# Patient Record
Sex: Female | Born: 1960 | Race: White | Hispanic: No | Marital: Married | Smoking: Former smoker
Health system: Southern US, Community
[De-identification: ages and names within clinical notes are randomized; demographics above are authoritative.]

## PROBLEM LIST (undated history)

## (undated) DIAGNOSIS — M329 Systemic lupus erythematosus, unspecified: Secondary | ICD-10-CM

## (undated) DIAGNOSIS — M1990 Unspecified osteoarthritis, unspecified site: Secondary | ICD-10-CM

## (undated) DIAGNOSIS — J449 Chronic obstructive pulmonary disease, unspecified: Secondary | ICD-10-CM

## (undated) DIAGNOSIS — I219 Acute myocardial infarction, unspecified: Secondary | ICD-10-CM

## (undated) DIAGNOSIS — I509 Heart failure, unspecified: Secondary | ICD-10-CM

## (undated) DIAGNOSIS — E039 Hypothyroidism, unspecified: Secondary | ICD-10-CM

## (undated) DIAGNOSIS — N3010 Interstitial cystitis (chronic) without hematuria: Secondary | ICD-10-CM

## (undated) DIAGNOSIS — I10 Essential (primary) hypertension: Secondary | ICD-10-CM

## (undated) DIAGNOSIS — N189 Chronic kidney disease, unspecified: Secondary | ICD-10-CM

## (undated) HISTORY — PX: ABDOMINAL HYSTERECTOMY: SHX81

## (undated) HISTORY — PX: CHOLECYSTECTOMY: SHX55

## (undated) HISTORY — PX: LEG SURGERY: SHX1003

---

## 2007-04-23 ENCOUNTER — Encounter: Admission: RE | Admit: 2007-04-23 | Discharge: 2007-04-23 | Payer: Self-pay | Admitting: Orthopedic Surgery

## 2007-05-15 ENCOUNTER — Ambulatory Visit (HOSPITAL_COMMUNITY): Admission: RE | Admit: 2007-05-15 | Discharge: 2007-05-16 | Payer: Self-pay | Admitting: Orthopedic Surgery

## 2009-02-27 ENCOUNTER — Ambulatory Visit (HOSPITAL_BASED_OUTPATIENT_CLINIC_OR_DEPARTMENT_OTHER): Admission: RE | Admit: 2009-02-27 | Discharge: 2009-02-27 | Payer: Self-pay | Admitting: Urology

## 2010-04-08 LAB — POCT HEMOGLOBIN-HEMACUE: Hemoglobin: 16.5 g/dL — ABNORMAL HIGH (ref 12.0–15.0)

## 2010-06-01 NOTE — Op Note (Signed)
[name], [name]                 ACCOUNT NO.:  [id number]   MEDICAL RECORD NO.:  [id number]          PATIENT TYPE:  OIB   LOCATION:  5023                         FACILITY:  [facility]   PHYSICIAN:  Burnard Bunting, M.D.    DATE OF BIRTH:  [birth year]   DATE OF PROCEDURE:  05/15/2007  DATE OF DISCHARGE:                               OPERATIVE REPORT   PREOPERATIVE DIAGNOSIS:  Left fibular nonunion.   POSTOPERATIVE DIAGNOSIS:  Left fibular nonunion.   PROCEDURE:  Left fibular nonunion take down with plating and iliac crest  bone grafting.   SURGEON:  Burnard Bunting, MD   ASSISTANT:  None.   ANESTHESIA:  General endotracheal.   ESTIMATED BLOOD LOSS:  Minimal.   INDICATIONS:  Kimberly Mahoney is a 50 year old patient with left fibular  nonunion, who presents now for operative management after established  nonunion.  She understands the risks and benefits and wants to proceed  with surgery.   PROCEDURE IN DETAIL:  The patient was brought to the operating room,  where general endotracheal anesthesia was induced and preoperative  antibiotics were administered.  The patient's left iliac crest and left  leg was briefly scrubbed with alcohol and Betadine, which allowed to air  dry and then prepped with DuraPrep solution and draped in a sterile  manner.  Collier Flowers was used to cover both operative fields.  A 3-cm incision  was made just about 2 cm proximal to the anterior-superior iliac crest.  The skin and subcutaneous tissue were sharply divided.  A periosteal  window was then raised off the iliac crest.  Osteotome was used to open  this window on three sides.  It was then hinged open and iliac crest  bone graft was obtained using a curette.  After obtaining the bone  graft, the #1 Vicryl suture was used to close the cortical hinge.  The  skin was then closed using interrupted 0-Vicryl suture and running  through a Prolene.  The anesthetic agent was placed into the skin edges.  This was  Steri-Stripped and impervious dressing was placed.  At this  time, attention was directed towards the ankle.  Ankle Esmarch was  utilized for approximately 45 minutes.  Lateral incision was made.  Full-  thickness periosteal flaps were developed from the anteroposterior  aspect of the fibula.  Fibular nonunion site was identified.  Soft  tissue and fibrous tissue was removed from the fracture site using a  small curette.  Both intramedullary canals were reestablished also using  curettes.  At this time, iliac crest bone grafting was placed into the  fracture site.  The fracture site was reduced and then held with a lag  screw.  This was confirmed in the AP and lateral planes under  fluoroscopy.  A plate was then applied with cortical and locking screws  with good reduction of pain.  At this time, tourniquet was released.  Bleeding points were cauterized with electrocautery.  Fascia was closed  over the plate.  Good plate distance and  good screw length was confirmed in the AP and lateral  planes under  fluoroscopy.  Fascia was then closed using 2-0 Vicryl followed by  interrupted 2-0 Vicryl and 3-0 nylon in the skin edges.  A bulky well-  padded posterior splint was applied.  The patient tolerated the  procedure well without immediate complication.      Burnard Bunting, M.D.  Electronically Signed     GSD/MEDQ  D:  05/15/2007  T:  05/16/2007  Job:  [id number]

## 2010-10-12 LAB — URINALYSIS, ROUTINE W REFLEX MICROSCOPIC
Bilirubin Urine: NEGATIVE
Glucose, UA: NEGATIVE
Hgb urine dipstick: NEGATIVE
Ketones, ur: NEGATIVE
Nitrite: NEGATIVE
Protein, ur: NEGATIVE
Specific Gravity, Urine: 1.018
Urobilinogen, UA: 0.2
pH: 5.5

## 2010-10-12 LAB — BASIC METABOLIC PANEL
BUN: 11
CO2: 24
Calcium: 8.8
Chloride: 104
Creatinine, Ser: 0.9
GFR calc Af Amer: >60
GFR calc non Af Amer: >60
Glucose, Bld: 103 — ABNORMAL HIGH
Potassium: 4.2
Sodium: 135

## 2010-10-12 LAB — URINE MICROSCOPIC-ADD ON

## 2010-10-12 LAB — CBC
HCT: 40.3
Hemoglobin: 14
MCHC: 34.8
MCV: 100.7 — ABNORMAL HIGH
Platelets: 174
RBC: 4.01
RDW: 12.7
WBC: 3.2 — ABNORMAL LOW

## 2011-09-16 ENCOUNTER — Ambulatory Visit (INDEPENDENT_AMBULATORY_CARE_PROVIDER_SITE_OTHER): Payer: BC Managed Care – PPO | Admitting: Urology

## 2011-09-16 DIAGNOSIS — R351 Nocturia: Secondary | ICD-10-CM

## 2011-09-16 DIAGNOSIS — N393 Stress incontinence (female) (male): Secondary | ICD-10-CM

## 2011-09-16 DIAGNOSIS — R3989 Other symptoms and signs involving the genitourinary system: Secondary | ICD-10-CM

## 2011-09-16 DIAGNOSIS — N3010 Interstitial cystitis (chronic) without hematuria: Secondary | ICD-10-CM

## 2011-09-16 DIAGNOSIS — R350 Frequency of micturition: Secondary | ICD-10-CM

## 2012-02-24 ENCOUNTER — Ambulatory Visit (INDEPENDENT_AMBULATORY_CARE_PROVIDER_SITE_OTHER): Payer: BC Managed Care – PPO | Admitting: Urology

## 2012-02-24 DIAGNOSIS — N3010 Interstitial cystitis (chronic) without hematuria: Secondary | ICD-10-CM

## 2012-11-09 ENCOUNTER — Encounter (INDEPENDENT_AMBULATORY_CARE_PROVIDER_SITE_OTHER): Payer: Self-pay

## 2012-11-09 ENCOUNTER — Ambulatory Visit (INDEPENDENT_AMBULATORY_CARE_PROVIDER_SITE_OTHER): Payer: BC Managed Care – PPO | Admitting: Urology

## 2012-11-09 DIAGNOSIS — N3010 Interstitial cystitis (chronic) without hematuria: Secondary | ICD-10-CM

## 2012-11-09 DIAGNOSIS — N393 Stress incontinence (female) (male): Secondary | ICD-10-CM

## 2013-02-08 ENCOUNTER — Ambulatory Visit (INDEPENDENT_AMBULATORY_CARE_PROVIDER_SITE_OTHER): Payer: BC Managed Care – PPO | Admitting: Urology

## 2013-02-08 DIAGNOSIS — N393 Stress incontinence (female) (male): Secondary | ICD-10-CM

## 2013-02-08 DIAGNOSIS — N3010 Interstitial cystitis (chronic) without hematuria: Secondary | ICD-10-CM

## 2013-05-17 ENCOUNTER — Ambulatory Visit (INDEPENDENT_AMBULATORY_CARE_PROVIDER_SITE_OTHER): Payer: BC Managed Care – PPO | Admitting: Urology

## 2013-05-17 DIAGNOSIS — N3020 Other chronic cystitis without hematuria: Secondary | ICD-10-CM

## 2013-08-16 ENCOUNTER — Ambulatory Visit (INDEPENDENT_AMBULATORY_CARE_PROVIDER_SITE_OTHER): Payer: BC Managed Care – PPO | Admitting: Urology

## 2013-08-16 DIAGNOSIS — N3010 Interstitial cystitis (chronic) without hematuria: Secondary | ICD-10-CM

## 2013-11-15 ENCOUNTER — Ambulatory Visit (INDEPENDENT_AMBULATORY_CARE_PROVIDER_SITE_OTHER): Payer: BC Managed Care – PPO | Admitting: Urology

## 2013-11-15 DIAGNOSIS — N3010 Interstitial cystitis (chronic) without hematuria: Secondary | ICD-10-CM

## 2013-11-15 DIAGNOSIS — R3989 Other symptoms and signs involving the genitourinary system: Secondary | ICD-10-CM

## 2014-03-28 ENCOUNTER — Ambulatory Visit: Payer: Self-pay | Admitting: Urology

## 2014-08-15 ENCOUNTER — Ambulatory Visit (INDEPENDENT_AMBULATORY_CARE_PROVIDER_SITE_OTHER): Payer: BLUE CROSS/BLUE SHIELD | Admitting: Urology

## 2014-08-15 DIAGNOSIS — N393 Stress incontinence (female) (male): Secondary | ICD-10-CM | POA: Diagnosis not present

## 2014-08-15 DIAGNOSIS — N3010 Interstitial cystitis (chronic) without hematuria: Secondary | ICD-10-CM

## 2014-09-24 ENCOUNTER — Emergency Department (HOSPITAL_COMMUNITY)
Admission: EM | Admit: 2014-09-24 | Discharge: 2014-09-24 | Disposition: A | Discharge status: Home / Self Care | Payer: BLUE CROSS/BLUE SHIELD | Attending: Emergency Medicine | Admitting: Emergency Medicine

## 2014-09-24 ENCOUNTER — Encounter (HOSPITAL_COMMUNITY): Payer: Self-pay | Admitting: Emergency Medicine

## 2014-09-24 ENCOUNTER — Emergency Department (HOSPITAL_COMMUNITY): Payer: BLUE CROSS/BLUE SHIELD

## 2014-09-24 DIAGNOSIS — N390 Urinary tract infection, site not specified: Secondary | ICD-10-CM | POA: Insufficient documentation

## 2014-09-24 DIAGNOSIS — R05 Cough: Secondary | ICD-10-CM | POA: Diagnosis not present

## 2014-09-24 DIAGNOSIS — R42 Dizziness and giddiness: Secondary | ICD-10-CM | POA: Diagnosis not present

## 2014-09-24 DIAGNOSIS — R110 Nausea: Secondary | ICD-10-CM | POA: Diagnosis not present

## 2014-09-24 DIAGNOSIS — R509 Fever, unspecified: Secondary | ICD-10-CM | POA: Diagnosis present

## 2014-09-24 DIAGNOSIS — Z720 Tobacco use: Secondary | ICD-10-CM | POA: Insufficient documentation

## 2014-09-24 HISTORY — DX: Interstitial cystitis (chronic) without hematuria: N30.10

## 2014-09-24 LAB — URINE MICROSCOPIC-ADD ON

## 2014-09-24 LAB — URINALYSIS, ROUTINE W REFLEX MICROSCOPIC
Bilirubin Urine: NEGATIVE
GLUCOSE, UA: NEGATIVE mg/dL
Ketones, ur: NEGATIVE mg/dL
Nitrite: NEGATIVE
PROTEIN: NEGATIVE mg/dL
Specific Gravity, Urine: <1.005 — ABNORMAL LOW (ref 1.005–1.030)
Urobilinogen, UA: 0.2 mg/dL (ref 0.0–1.0)
pH: 6 (ref 5.0–8.0)

## 2014-09-24 MED ORDER — CEFTRIAXONE SODIUM 1 G IJ SOLR
INTRAMUSCULAR | Status: AC
  Filled 2014-09-24: qty 10

## 2014-09-24 MED ORDER — CEPHALEXIN 500 MG PO CAPS: 500.0000 mg | ORAL_CAPSULE | Freq: Four times a day (QID) | ORAL | Status: DC

## 2014-09-24 MED ORDER — LIDOCAINE HCL (PF) 1 % IJ SOLN
INTRAMUSCULAR | Status: AC
  Filled 2014-09-24: qty 5

## 2014-09-24 MED ORDER — CEFTRIAXONE SODIUM 1 G IJ SOLR
1.0000 g | Freq: Once | INTRAMUSCULAR | Status: AC
  Administered 2014-09-24: 1 g via INTRAMUSCULAR
  Filled 2014-09-24: qty 10

## 2014-09-24 NOTE — ED Provider Notes (Signed)
CSN: [id number]   Arrival date & time 09/24/14 2013  History  This chart was scribed for Mancel Bale, MD by Bethel Born, ED Scribe. This patient was seen in room APA04/APA04 and the patient's care was started at 9:15 PM.  Chief Complaint  Patient presents with  . Cough  . Fever    HPI The history is provided by the patient. No language interpreter was used.   Kimberly Mahoney is a 54 y.o. female with PMHx of interstitial cystitis who presents to the Emergency Department complaining of a worsening cough productive of green sputum with onset 3 days ago. The cough has not improved with OTC cough syrup. Associated symptoms include generalized weakness, light headedness, fever of 102 at home (last used Tylenol around 6 PM), nausea, back pain, and increased dysuria. Pt denies vomiting and diarrhea. Primary care is Dr. Trisha Mangle in [place].  There are no other known modifying factors.  Past Medical History  Diagnosis Date  . Interstitial cystitis     Past Surgical History  Procedure Laterality Date  . Cholecystectomy    . Cesarean section    . Abdominal hysterectomy    . Leg surgery Left     History reviewed. No pertinent family history.  Social History  Substance Use Topics  . Smoking status: Current Every Day Smoker  . Smokeless tobacco: None  . Alcohol Use: No     Review of Systems  Constitutional: Positive for fever.  Respiratory: Positive for cough.   Gastrointestinal: Positive for nausea. Negative for vomiting.  Genitourinary: Positive for dysuria.  Musculoskeletal: Positive for myalgias.  All other systems reviewed and are negative.   Home Medications   Prior to Admission medications   Medication Sig Start Date End Date Taking? Authorizing Provider  amitriptyline (ELAVIL) 50 MG tablet Take 100 mg by mouth at bedtime. 08/15/14  Yes Historical Provider, MD  HYDROcodone-acetaminophen (NORCO/VICODIN) 5-325 MG per tablet Take 1 tablet by mouth every 6 (six) hours as needed. pain  09/15/14  Yes Historical Provider, MD  cephALEXin (KEFLEX) 500 MG capsule Take 1 capsule (500 mg total) by mouth 4 (four) times daily. 09/24/14   Mancel Bale, MD    Allergies  Review of patient's allergies indicates no known allergies.  Triage Vitals: BP 145/89 mmHg  Pulse 120  Temp(Src) 99.8 F (37.7 C) (Oral)  Resp 20  Ht 5\' 5"  (1.651 m)  Wt 180 lb (81.647 kg)  BMI 29.95 kg/m2  SpO2 98%  Physical Exam  Constitutional: She is oriented to person, place, and time. She appears well-developed and well-nourished.  HENT:  Head: Normocephalic and atraumatic.  Eyes: Conjunctivae and EOM are normal. Pupils are equal, round, and reactive to light.  Neck: Normal range of motion and phonation normal. Neck supple.  Cardiovascular: Normal rate and regular rhythm.   Pulmonary/Chest: Effort normal and breath sounds normal. She has no wheezes. She has no rales. She exhibits no tenderness.  Abdominal: Soft. She exhibits no distension. There is tenderness. There is no guarding.  Mild bilateral lower quadrant tenderness  Musculoskeletal: Normal range of motion.  Neurological: She is alert and oriented to person, place, and time. She exhibits normal muscle tone.  Skin: Skin is warm and dry.  Psychiatric: She has a normal mood and affect. Her behavior is normal. Judgment and thought content normal.  Nursing note and vitals reviewed.   ED Course  Procedures   DIAGNOSTIC STUDIES: Oxygen Saturation is 98% on RA, normal by my interpretation.    COORDINATION  OF CARE: 9:29 PM Discussed treatment plan which includes CXR and lab work with pt at bedside and pt agreed to plan.  Medications  lidocaine (PF) (XYLOCAINE) 1 % injection (not administered)  cefTRIAXone (ROCEPHIN) 1 G injection (not administered)  lidocaine (PF) (XYLOCAINE) 1 % injection (not administered)  cefTRIAXone (ROCEPHIN) injection 1 g (1 g Intramuscular Given 09/24/14 2224)   Patient Vitals for the past 24 hrs:  BP Temp Temp src  Pulse Resp SpO2 Height Weight  09/24/14 2024 145/89 mmHg 99.8 F (37.7 C) Oral 120 20 98 %  (1.651 m) 180 lb (81.647 kg)     Labs Reviewed  URINALYSIS, ROUTINE W REFLEX MICROSCOPIC (NOT AT [facility]) - Abnormal; Notable for the following:    APPearance HAZY (*)    Specific Gravity, Urine <1.005 (*)    Hgb urine dipstick SMALL (*)    Leukocytes, UA LARGE (*)    All other components within normal limits  URINE MICROSCOPIC-ADD ON - Abnormal; Notable for the following:    Squamous Epithelial / LPF FEW (*)    Bacteria, UA MANY (*)    All other components within normal limits  URINE CULTURE    Imaging Review Dg Chest 2 View  09/24/2014   CLINICAL DATA:  Shortness of breath, productive cough, Mid chest pain, and dizziness for 3 days.  EXAM: CHEST  2 VIEW  COMPARISON:  05/27/2013  FINDINGS: The heart size and mediastinal contours are within normal limits. Both lungs are clear. The visualized skeletal structures are unremarkable.  IMPRESSION: No active cardiopulmonary disease.   Electronically Signed   By: Burman Nieves M.D.   On: 09/24/2014 21:05      MDM   Final diagnoses:  Urinary tract infection without hematuria, site unspecified   Nursing Notes Reviewed/ Care Coordinated Applicable Imaging Reviewed Interpretation of Laboratory Data incorporated into ED treatment  The patient appears reasonably screened and/or stabilized for discharge and I doubt any other medical condition or other [facility] requiring further screening, evaluation, or treatment in the ED at this time prior to discharge.  Plan: Home Medications- Keflex; Home Treatments- rest; return here if the recommended treatment, does not improve the symptoms; Recommended follow up- PCP prn and 1 week for check up  I personally performed the services described in this documentation, which was scribed in my presence. The recorded information has been reviewed and is accurate.    Mancel Bale, MD 09/24/14 2253

## 2014-09-24 NOTE — ED Notes (Signed)
Patient reports productive cough with Ercel Pepitone mucus, fever, chills, and fatigue. Symptoms x approximately 3 days. Patient also reports she feels dizzy and states she thinks she may have a urinary tract infection as well.

## 2014-09-24 NOTE — Discharge Instructions (Signed)
Drink plenty of fluids. Take Tylenol every 4 hours for pain or fever. See your Dr. for a checkup in one or 2 weeks.   Urinary Tract Infection Urinary tract infections (UTIs) can develop anywhere along your urinary tract. Your urinary tract is your body's drainage system for removing wastes and extra water. Your urinary tract includes two kidneys, two ureters, a bladder, and a urethra. Your kidneys are a pair of bean-shaped organs. Each kidney is about the size of your fist. They are located below your ribs, one on each side of your spine. CAUSES Infections are caused by microbes, which are microscopic organisms, including fungi, viruses, and bacteria. These organisms are so small that they can only be seen through a microscope. Bacteria are the microbes that most commonly cause UTIs. SYMPTOMS  Symptoms of UTIs may vary by age and gender of the patient and by the location of the infection. Symptoms in young women typically include a frequent and intense urge to urinate and a painful, burning feeling in the bladder or urethra during urination. Older women and men are more likely to be tired, shaky, and weak and have muscle aches and abdominal pain. A fever may mean the infection is in your kidneys. Other symptoms of a kidney infection include pain in your back or sides below the ribs, nausea, and vomiting. DIAGNOSIS To diagnose a UTI, your caregiver will ask you about your symptoms. Your caregiver also will ask to provide a urine sample. The urine sample will be tested for bacteria and white blood cells. White blood cells are made by your body to help fight infection. TREATMENT  Typically, UTIs can be treated with medication. Because most UTIs are caused by a bacterial infection, they usually can be treated with the use of antibiotics. The choice of antibiotic and length of treatment depend on your symptoms and the type of bacteria causing your infection. HOME CARE INSTRUCTIONS  If you were prescribed  antibiotics, take them exactly as your caregiver instructs you. Finish the medication even if you feel better after you have only taken some of the medication.  Drink enough water and fluids to keep your urine clear or pale yellow.  Avoid caffeine, tea, and carbonated beverages. They tend to irritate your bladder.  Empty your bladder often. Avoid holding urine for long periods of time.  Empty your bladder before and after sexual intercourse.  After a bowel movement, women should cleanse from front to back. Use each tissue only once. SEEK MEDICAL CARE IF:   You have back pain.  You develop a fever.  Your symptoms do not begin to resolve within 3 days. SEEK IMMEDIATE MEDICAL CARE IF:   You have severe back pain or lower abdominal pain.  You develop chills.  You have nausea or vomiting.  You have continued burning or discomfort with urination. MAKE SURE YOU:   Understand these instructions.  Will watch your condition.  Will get help right away if you are not doing well or get worse. Document Released: 10/13/2004 Document Revised: 07/05/2011 Document Reviewed: 02/11/2011 [facility] Patient Information 2015 [place], [place]. This information is not intended to replace advice given to you by your health care provider. Make sure you discuss any questions you have with your health care provider.

## 2014-09-27 LAB — URINE CULTURE: Culture: >=100000

## 2014-11-14 ENCOUNTER — Ambulatory Visit (INDEPENDENT_AMBULATORY_CARE_PROVIDER_SITE_OTHER): Payer: BLUE CROSS/BLUE SHIELD | Admitting: Urology

## 2014-11-14 DIAGNOSIS — R3989 Other symptoms and signs involving the genitourinary system: Secondary | ICD-10-CM | POA: Diagnosis not present

## 2014-11-14 DIAGNOSIS — N3010 Interstitial cystitis (chronic) without hematuria: Secondary | ICD-10-CM

## 2014-11-14 DIAGNOSIS — N393 Stress incontinence (female) (male): Secondary | ICD-10-CM | POA: Diagnosis not present

## 2015-01-23 ENCOUNTER — Ambulatory Visit (INDEPENDENT_AMBULATORY_CARE_PROVIDER_SITE_OTHER): Payer: BLUE CROSS/BLUE SHIELD | Admitting: Urology

## 2015-01-23 DIAGNOSIS — N3941 Urge incontinence: Secondary | ICD-10-CM | POA: Diagnosis not present

## 2015-01-23 DIAGNOSIS — N3010 Interstitial cystitis (chronic) without hematuria: Secondary | ICD-10-CM

## 2015-01-23 DIAGNOSIS — R3915 Urgency of urination: Secondary | ICD-10-CM | POA: Diagnosis not present

## 2015-02-13 ENCOUNTER — Ambulatory Visit (INDEPENDENT_AMBULATORY_CARE_PROVIDER_SITE_OTHER): Payer: BLUE CROSS/BLUE SHIELD | Admitting: Urology

## 2015-02-13 DIAGNOSIS — N3010 Interstitial cystitis (chronic) without hematuria: Secondary | ICD-10-CM

## 2015-02-13 DIAGNOSIS — R3989 Other symptoms and signs involving the genitourinary system: Secondary | ICD-10-CM | POA: Diagnosis not present

## 2016-07-22 ENCOUNTER — Ambulatory Visit (INDEPENDENT_AMBULATORY_CARE_PROVIDER_SITE_OTHER): Payer: BLUE CROSS/BLUE SHIELD | Admitting: Urology

## 2016-07-22 ENCOUNTER — Other Ambulatory Visit (HOSPITAL_COMMUNITY)
Admission: RE | Admit: 2016-07-22 | Discharge: 2016-07-22 | Disposition: A | Discharge status: Home / Self Care | Payer: BLUE CROSS/BLUE SHIELD | Source: Other Acute Inpatient Hospital | Attending: Urology | Admitting: Urology

## 2016-07-22 DIAGNOSIS — N3010 Interstitial cystitis (chronic) without hematuria: Secondary | ICD-10-CM | POA: Insufficient documentation

## 2016-07-22 LAB — URINALYSIS, COMPLETE (UACMP) WITH MICROSCOPIC
BACTERIA UA: NONE SEEN
BILIRUBIN URINE: NEGATIVE
Glucose, UA: NEGATIVE mg/dL
Hgb urine dipstick: NEGATIVE
Ketones, ur: NEGATIVE mg/dL
NITRITE: NEGATIVE
PH: 5 (ref 5.0–8.0)
Protein, ur: NEGATIVE mg/dL
SPECIFIC GRAVITY, URINE: 1.014 (ref 1.005–1.030)

## 2016-07-24 LAB — URINE CULTURE

## 2016-08-04 ENCOUNTER — Ambulatory Visit (INDEPENDENT_AMBULATORY_CARE_PROVIDER_SITE_OTHER): Payer: BLUE CROSS/BLUE SHIELD | Admitting: Orthopaedic Surgery

## 2016-08-04 ENCOUNTER — Encounter (INDEPENDENT_AMBULATORY_CARE_PROVIDER_SITE_OTHER): Payer: Self-pay | Admitting: Orthopaedic Surgery

## 2016-08-04 VITALS — BP 140/85 | HR 84 | Ht 66.0 in | Wt 170.0 lb

## 2016-08-04 DIAGNOSIS — S8001XA Contusion of right knee, initial encounter: Secondary | ICD-10-CM | POA: Diagnosis not present

## 2016-08-14 NOTE — Progress Notes (Signed)
Office Visit Note   Patient: Kimberly Mahoney           Date of Birth: 01/04/1961           MRN: [id number] Visit Date: 08/04/2016              Requested by: No referring provider defined for this encounter. PCP: Wendall Papa   Assessment & Plan: Visit Diagnoses:  1. Contusion of right knee, initial encounter     Plan: Exam shows no evidence of meniscal or ligamentous injury. Patient suffered a right knee contusion but is improved with the last 2 days. She'll continue conservative treatment and can continue using ice and anti-inflammatories.  Follow-Up Instructions: No Follow-up on file.   Orders:  No orders of the defined types were placed in this encounter.  No orders of the defined types were placed in this encounter.     Procedures: No procedures performed   Clinical Data: No additional findings.   Subjective: Chief Complaint  Patient presents with  . Right Knee - Pain    HPI 56 year old female seen with a fall that occurred 3 days ago. Patient states she went straight down on concrete onto her right knee and she's had pain with weightbearing she had swelling had difficulty bending her knee. She had some hydrocodone normally used for bladder condition which she took and also some Aleve as well as elevation and ice. She was concerned that there may be a fracture.  Review of Systems 14 point review of systems positive stress reflux arthritis, bladder problems bronchitis, depression, hypertension, pneumonia, thyroid condition. She said previous gallbladder surgery 1990 hysterectomy 1999. She takes thyroid supplement for hypothyroidism. She works as a Research officer, political party and smokes one half pack per day and drinks socially. Negative for seizure MI, DVT, PE   Objective: Vital Signs: BP 140/85   Pulse 84   Ht 5\' 6"  (1.676 m)   Wt 170 lb (77.1 kg)   BMI 27.44 kg/m   Physical Exam  Constitutional: She is oriented to person, place, and time. She appears  well-developed.  HENT:  Head: Normocephalic.  Right Ear: External ear normal.  Left Ear: External ear normal.  Eyes: Pupils are equal, round, and reactive to light.  Neck: No tracheal deviation present. No thyromegaly present.  Cardiovascular: Normal rate.   Pulmonary/Chest: Effort normal.  Abdominal: Soft.  Musculoskeletal:  Patient symptoms toward slight right knee limp. Mild prominence of the prepatellar bursa at the inferior pole the patella as well as over the tibial tubercle for so with intact skin. Collateral cruciate ligament exam is normal. And with hip range of motion. Distal pulses are 2+ and symmetrical for ankle range of motion. Negative straight leg raising no trochanteric bursal tenderness. Iliotibial band as bursa is normal.  Neurological: She is alert and oriented to person, place, and time.  Skin: Skin is warm and dry.  Psychiatric: She has a normal mood and affect. Her behavior is normal.    Ortho Exam patient's able straight leg raise without extension deficit. She flexes to the 120 no significant knee effusion is noted. She has some tightness anteriorly over the prepatellar bursa with flexion. Healed left fibular incision from previous takedown fibular nonunion and iliac crest bone grafting.  Specialty Comments:  No specialty comments available.  Imaging: No results found.   PMFS History: There are no active problems to display for this patient.  Past Medical History:  Diagnosis Date  . Interstitial cystitis  No family history on file.  Past Surgical History:  Procedure Laterality Date  . ABDOMINAL HYSTERECTOMY    . CESAREAN SECTION    . CHOLECYSTECTOMY    . LEG SURGERY Left    Social History   Occupational History  . Not on file.   Social History Main Topics  . Smoking status: Current Every Day Smoker  . Smokeless tobacco: Never Used  . Alcohol use Yes     Comment: socially  . Drug use: No  . Sexual activity: Not on file

## 2016-08-15 ENCOUNTER — Ambulatory Visit (INDEPENDENT_AMBULATORY_CARE_PROVIDER_SITE_OTHER): Payer: BLUE CROSS/BLUE SHIELD | Admitting: Otolaryngology

## 2016-08-15 DIAGNOSIS — H6983 Other specified disorders of Eustachian tube, bilateral: Secondary | ICD-10-CM | POA: Diagnosis not present

## 2016-08-15 DIAGNOSIS — H903 Sensorineural hearing loss, bilateral: Secondary | ICD-10-CM | POA: Diagnosis not present

## 2016-08-15 DIAGNOSIS — J342 Deviated nasal septum: Secondary | ICD-10-CM

## 2016-08-15 DIAGNOSIS — J343 Hypertrophy of nasal turbinates: Secondary | ICD-10-CM

## 2016-08-22 ENCOUNTER — Other Ambulatory Visit (INDEPENDENT_AMBULATORY_CARE_PROVIDER_SITE_OTHER): Payer: Self-pay | Admitting: Otolaryngology

## 2016-08-22 DIAGNOSIS — J329 Chronic sinusitis, unspecified: Secondary | ICD-10-CM

## 2017-04-14 ENCOUNTER — Ambulatory Visit (INDEPENDENT_AMBULATORY_CARE_PROVIDER_SITE_OTHER): Payer: BLUE CROSS/BLUE SHIELD | Admitting: Urology

## 2017-04-14 DIAGNOSIS — N3010 Interstitial cystitis (chronic) without hematuria: Secondary | ICD-10-CM

## 2017-04-14 DIAGNOSIS — N393 Stress incontinence (female) (male): Secondary | ICD-10-CM

## 2017-04-14 DIAGNOSIS — R351 Nocturia: Secondary | ICD-10-CM

## 2017-04-14 DIAGNOSIS — R3982 Chronic bladder pain: Secondary | ICD-10-CM

## 2017-07-14 ENCOUNTER — Ambulatory Visit (INDEPENDENT_AMBULATORY_CARE_PROVIDER_SITE_OTHER): Payer: BLUE CROSS/BLUE SHIELD | Admitting: Urology

## 2017-07-14 DIAGNOSIS — R3982 Chronic bladder pain: Secondary | ICD-10-CM

## 2017-07-14 DIAGNOSIS — N3010 Interstitial cystitis (chronic) without hematuria: Secondary | ICD-10-CM | POA: Diagnosis not present

## 2017-07-14 DIAGNOSIS — N393 Stress incontinence (female) (male): Secondary | ICD-10-CM

## 2018-07-13 ENCOUNTER — Ambulatory Visit (INDEPENDENT_AMBULATORY_CARE_PROVIDER_SITE_OTHER): Payer: BC Managed Care – PPO | Admitting: Urology

## 2018-07-13 DIAGNOSIS — N3010 Interstitial cystitis (chronic) without hematuria: Secondary | ICD-10-CM | POA: Diagnosis not present

## 2018-07-13 DIAGNOSIS — N393 Stress incontinence (female) (male): Secondary | ICD-10-CM

## 2019-01-18 DIAGNOSIS — I219 Acute myocardial infarction, unspecified: Secondary | ICD-10-CM

## 2019-01-18 HISTORY — DX: Acute myocardial infarction, unspecified: I21.9

## 2019-06-04 ENCOUNTER — Telehealth: Payer: Self-pay | Admitting: Urology

## 2019-06-04 NOTE — Telephone Encounter (Signed)
Pt will bring disability papers by the office. Pt aware Dr. Annabell Howells will not be back in the office until May 28th

## 2019-06-04 NOTE — Telephone Encounter (Signed)
Patient states she was told to bring her disability form here per [facility]. She asked if you could call her to make sure. [phone]

## 2019-06-28 ENCOUNTER — Ambulatory Visit: Payer: BC Managed Care – PPO | Admitting: Urology

## 2019-07-12 ENCOUNTER — Ambulatory Visit: Payer: BC Managed Care – PPO | Admitting: Urology

## 2019-08-15 NOTE — Progress Notes (Deleted)
Subjective:  1. IC (interstitial cystitis)        Kimberly Mahoney returns today in f/u for her history of IC. She is getting hydrocodone from Dr. Lennie Hummer for her pain. She is doing ok if she monitors her diet. She had a UTI's about 6 months ago. She remains on elavil 50mg  and she is back on Hydroxyzine 25mg . She had an HOD with cysto and biopsy in 2011 by Dr. but no cysto's since. She does postural voiding which can help her symptoms. She continues to have SUI and uses 6-7 small pads daily. She has a good stream but she has pain. She doesn't feel like she empties completely and has to lean forward to void. She has no changes in her symptoms  ROS:  ROS:  A complete review of systems was performed.  All systems are negative except for pertinent findings as noted.   ROS  No Known Allergies  Outpatient Encounter Medications as of 08/16/2019  Medication Sig  . amitriptyline (ELAVIL) 50 MG tablet Take 100 mg by mouth at bedtime.  . cephALEXin (KEFLEX) 500 MG capsule Take 1 capsule (500 mg total) by mouth 4 (four) times daily. (Patient not taking: Reported on 08/04/2016)  . HYDROcodone-acetaminophen (NORCO/VICODIN) 5-325 MG per tablet Take 1 tablet by mouth every 6 (six) hours as needed. pain  . levothyroxine (SYNTHROID, LEVOTHROID) 100 MCG tablet   . metoprolol succinate (TOPROL-XL) 25 MG 24 hr tablet    No facility-administered encounter medications on file as of 08/16/2019.    Past Medical History:  Diagnosis Date  . Interstitial cystitis     Past Surgical History:  Procedure Laterality Date  . ABDOMINAL HYSTERECTOMY    . CESAREAN SECTION    . CHOLECYSTECTOMY    . LEG SURGERY Left     Social History   Socioeconomic History  . Marital status: Married    Spouse name: Not on file  . Number of children: Not on file  . Years of education: Not on file  . Highest education level: Not on file  Occupational History  . Not on file  Tobacco Use  . Smoking status: Current Every Day  Smoker  . Smokeless tobacco: Never Used  Substance and Sexual Activity  . Alcohol use: Yes    Comment: socially  . Drug use: No  . Sexual activity: Not on file  Other Topics Concern  . Not on file  Social History Narrative  . Not on file   Social Determinants of Health   Financial Resource Strain:   . Difficulty of Paying Living Expenses:   Food Insecurity:   . Worried About 08/06/2016 in the Last Year:   . 08/18/2019 in the Last Year:   Transportation Needs:   . Programme researcher, broadcasting/film/video (Medical):   Barista Lack of Transportation (Non-Medical):   Physical Activity:   . Days of Exercise per Week:   . Minutes of Exercise per Session:   Stress:   . Feeling of Stress :   Social Connections:   . Frequency of Communication with Friends and Family:   . Frequency of Social Gatherings with Friends and Family:   . Attends Religious Services:   . Active Member of Clubs or Organizations:   . Attends Freight forwarder Meetings:   Marland Kitchen Marital Status:   Intimate Partner Violence:   . Fear of Current or Ex-Partner:   . Emotionally Abused:   Banker Physically Abused:   . Sexually Abused:  No family history on file.     Objective: There were no vitals filed for this visit.   Physical Exam  Lab Results:  No results found for this or any previous visit (from the past 24 hour(s)).  BMET No results for input(s): NA, K, CL, CO2, GLUCOSE, BUN, CREATININE, CALCIUM in the last 72 hours. PSA No results found for: PSA No results found for: TESTOSTERONE    Studies/Results: No results found.    Assessment & Plan: No problem-specific Assessment & Plan notes found for this encounter.    No orders of the defined types were placed in this encounter.    No orders of the defined types were placed in this encounter.     No follow-ups on file.   CC: Johnston Ebbs 08/15/2019

## 2019-08-16 ENCOUNTER — Ambulatory Visit: Payer: BC Managed Care – PPO | Admitting: Urology

## 2020-04-29 ENCOUNTER — Emergency Department (HOSPITAL_COMMUNITY): Payer: BC Managed Care – PPO

## 2020-04-29 ENCOUNTER — Encounter (HOSPITAL_COMMUNITY): Payer: Self-pay | Admitting: Emergency Medicine

## 2020-04-29 ENCOUNTER — Emergency Department (HOSPITAL_COMMUNITY)
Admission: EM | Admit: 2020-04-29 | Discharge: 2020-04-30 | Disposition: A | Discharge status: Home / Self Care | Payer: BC Managed Care – PPO | Attending: Emergency Medicine | Admitting: Emergency Medicine

## 2020-04-29 ENCOUNTER — Other Ambulatory Visit: Payer: Self-pay

## 2020-04-29 DIAGNOSIS — S82851A Displaced trimalleolar fracture of right lower leg, initial encounter for closed fracture: Secondary | ICD-10-CM | POA: Insufficient documentation

## 2020-04-29 DIAGNOSIS — S0990XA Unspecified injury of head, initial encounter: Secondary | ICD-10-CM | POA: Diagnosis not present

## 2020-04-29 DIAGNOSIS — I509 Heart failure, unspecified: Secondary | ICD-10-CM | POA: Insufficient documentation

## 2020-04-29 DIAGNOSIS — W01198A Fall on same level from slipping, tripping and stumbling with subsequent striking against other object, initial encounter: Secondary | ICD-10-CM | POA: Diagnosis not present

## 2020-04-29 DIAGNOSIS — F17200 Nicotine dependence, unspecified, uncomplicated: Secondary | ICD-10-CM | POA: Diagnosis not present

## 2020-04-29 DIAGNOSIS — R55 Syncope and collapse: Secondary | ICD-10-CM | POA: Insufficient documentation

## 2020-04-29 DIAGNOSIS — I110 Hypertensive heart disease with heart failure: Secondary | ICD-10-CM | POA: Insufficient documentation

## 2020-04-29 DIAGNOSIS — J449 Chronic obstructive pulmonary disease, unspecified: Secondary | ICD-10-CM | POA: Diagnosis not present

## 2020-04-29 DIAGNOSIS — Z79899 Other long term (current) drug therapy: Secondary | ICD-10-CM | POA: Diagnosis not present

## 2020-04-29 DIAGNOSIS — W19XXXA Unspecified fall, initial encounter: Secondary | ICD-10-CM

## 2020-04-29 DIAGNOSIS — S99911A Unspecified injury of right ankle, initial encounter: Secondary | ICD-10-CM | POA: Diagnosis present

## 2020-04-29 DIAGNOSIS — Y92000 Kitchen of unspecified non-institutional (private) residence as  the place of occurrence of the external cause: Secondary | ICD-10-CM | POA: Insufficient documentation

## 2020-04-29 HISTORY — DX: Heart failure, unspecified: I50.9

## 2020-04-29 HISTORY — DX: Systemic lupus erythematosus, unspecified: M32.9

## 2020-04-29 HISTORY — DX: Chronic obstructive pulmonary disease, unspecified: J44.9

## 2020-04-29 HISTORY — DX: Essential (primary) hypertension: I10

## 2020-04-29 HISTORY — DX: Acute myocardial infarction, unspecified: I21.9

## 2020-04-29 LAB — CBC WITH DIFFERENTIAL/PLATELET
Abs Immature Granulocytes: 0.04 10*3/uL (ref 0.00–0.07)
Basophils Absolute: 0 10*3/uL (ref 0.0–0.1)
Basophils Relative: 1 %
Eosinophils Absolute: 0 10*3/uL (ref 0.0–0.5)
Eosinophils Relative: 1 %
HCT: 41.1 % (ref 36.0–46.0)
Hemoglobin: 14 g/dL (ref 12.0–15.0)
Immature Granulocytes: 1 %
Lymphocytes Relative: 16 %
Lymphs Abs: 0.9 10*3/uL (ref 0.7–4.0)
MCH: 35 pg — ABNORMAL HIGH (ref 26.0–34.0)
MCHC: 34.1 g/dL (ref 30.0–36.0)
MCV: 102.8 fL — ABNORMAL HIGH (ref 80.0–100.0)
Monocytes Absolute: 0.5 10*3/uL (ref 0.1–1.0)
Monocytes Relative: 10 %
Neutro Abs: 3.9 10*3/uL (ref 1.7–7.7)
Neutrophils Relative %: 71 %
Platelets: 139 10*3/uL — ABNORMAL LOW (ref 150–400)
RBC: 4 MIL/uL (ref 3.87–5.11)
RDW: 12.8 % (ref 11.5–15.5)
WBC: 5.4 10*3/uL (ref 4.0–10.5)
nRBC: 0 % (ref 0.0–0.2)

## 2020-04-29 LAB — BRAIN NATRIURETIC PEPTIDE: B Natriuretic Peptide: 64 pg/mL (ref 0.0–100.0)

## 2020-04-29 MED ORDER — MORPHINE SULFATE (PF) 4 MG/ML IV SOLN
4.0000 mg | Freq: Once | INTRAVENOUS | Status: DC
Start: 2020-04-29 — End: 2020-04-29
  Filled 2020-04-29: qty 1

## 2020-04-29 MED ORDER — ONDANSETRON HCL 4 MG/2ML IJ SOLN
4.0000 mg | Freq: Once | INTRAMUSCULAR | Status: AC
  Administered 2020-04-29: 4 mg via INTRAVENOUS
  Filled 2020-04-29: qty 2

## 2020-04-29 MED ORDER — SODIUM CHLORIDE 0.9 % IV BOLUS
1000.0000 mL | Freq: Once | INTRAVENOUS | Status: AC
  Administered 2020-04-29: 1000 mL via INTRAVENOUS

## 2020-04-29 MED ORDER — FENTANYL CITRATE (PF) 100 MCG/2ML IJ SOLN
75.0000 ug | Freq: Once | INTRAMUSCULAR | Status: AC
  Administered 2020-04-29: 75 ug via INTRAVENOUS
  Filled 2020-04-29: qty 2

## 2020-04-29 NOTE — ED Notes (Signed)
Pt returned from CT °

## 2020-04-29 NOTE — ED Triage Notes (Addendum)
[facility] - pt states that she fell on the way to the kitchen, pt hit head and lost consciousness. Pt also has deformity to right ankle. Pt has palpable pulse and can wiggle toes on right side.

## 2020-04-29 NOTE — ED Notes (Signed)
Pt transported to CT ?

## 2020-04-29 NOTE — ED Provider Notes (Addendum)
[facility] EMERGENCY DEPARTMENT Provider Note   CSN: [id number] Arrival date & time: 04/29/20  2247     History Chief Complaint  Patient presents with  . Loss of Consciousness    Kimberly Mahoney is a 60 y.o. female.  Patient is a 60 year old female with past medical history of congestive heart failure, COPD, prior MI, hypertension, and cutaneous lupus.  Patient brought by EMS for evaluation of fall.  Patient tells me she has been having intermittent leg weakness for the past several months.  This evening she was attempting to ambulate when her legs became weak and gave out on her.  She fell down and injured her right ankle.  She fell backward and hit the back of her head.  She reports a brief loss of consciousness and complains of headache.  Patient does not take blood thinners.  She denies any chest pain or difficulty breathing.  The history is provided by the patient.  Loss of Consciousness Episode history:  Single Most recent episode:  Today Progression:  Resolved Chronicity:  New Relieved by:  Nothing Worsened by:  Nothing Ineffective treatments:  None tried      Past Medical History:  Diagnosis Date  . CHF (congestive heart failure) (HCC)   . COPD (chronic obstructive pulmonary disease) (HCC)   . Heart attack (HCC)   . Hypertension   . Interstitial cystitis   . Lupus (HCC)     There are no problems to display for this patient.   Past Surgical History:  Procedure Laterality Date  . ABDOMINAL HYSTERECTOMY    . CESAREAN SECTION    . CHOLECYSTECTOMY    . LEG SURGERY Left      OB History   No obstetric history on file.     No family history on file.  Social History   Tobacco Use  . Smoking status: Current Every Day Smoker  . Smokeless tobacco: Never Used  Substance Use Topics  . Alcohol use: Yes    Comment: socially  . Drug use: No    Home Medications Prior to Admission medications   Medication Sig Start Date End Date Taking? Authorizing Provider   amitriptyline (ELAVIL) 50 MG tablet Take 100 mg by mouth at bedtime. 08/15/14   [provider]  cephALEXin (KEFLEX) 500 MG capsule Take 1 capsule (500 mg total) by mouth 4 (four) times daily. Patient not taking: Reported on 08/04/2016 09/24/14   Mancel Bale, MD  HYDROcodone-acetaminophen (NORCO/VICODIN) 5-325 MG per tablet Take 1 tablet by mouth every 6 (six) hours as needed. pain 09/15/14   [provider]  levothyroxine (SYNTHROID, LEVOTHROID) 100 MCG tablet  07/06/16   [provider]  metoprolol succinate (TOPROL-XL) 25 MG 24 hr tablet  06/21/16   [provider]    Allergies    Lisinopril  Review of Systems   Review of Systems  Cardiovascular: Positive for syncope.  All other systems reviewed and are negative.   Physical Exam Updated Vital Signs BP (!) 98/55 (BP Location: Right Arm)   Pulse 81   Temp 97.9 F (36.6 C) (Oral)   Resp 17   Ht 5\' 6"  (1.676 m)   Wt 83.5 kg   SpO2 97%   BMI 29.70 kg/m   Physical Exam Vitals and nursing note reviewed.  Constitutional:      General: She is not in acute distress.    Appearance: She is well-developed. She is not diaphoretic.  HENT:     Head: Normocephalic and atraumatic.  Eyes:     Extraocular Movements: Extraocular movements intact.     Pupils: Pupils are equal, round, and reactive to light.  Cardiovascular:     Rate and Rhythm: Normal rate and regular rhythm.     Heart sounds: No murmur heard. No friction rub. No gallop.   Pulmonary:     Effort: Pulmonary effort is normal. No respiratory distress.     Breath sounds: Normal breath sounds. No wheezing.  Abdominal:     General: Bowel sounds are normal. There is no distension.     Palpations: Abdomen is soft.     Tenderness: There is no abdominal tenderness.  Musculoskeletal:        General: Normal range of motion.     Cervical back: Normal range of motion and neck supple.     Comments: The right ankle has obvious deformity.  There is  external rotation of the foot with tenting at the level of the medial malleolus.  DP pulses are palpable and motor and sensation are intact throughout the entire foot.  Skin:    General: Skin is warm and dry.  Neurological:     General: No focal deficit present.     Mental Status: She is alert and oriented to person, place, and time.     Cranial Nerves: No cranial nerve deficit.     Sensory: No sensory deficit.     Motor: No weakness.     Coordination: Coordination normal.     ED Results / Procedures / Treatments   Labs (all labs ordered are listed, but only abnormal results are displayed) Labs Reviewed - No data to display  EKG EKG Interpretation  Date/Time:  Wednesday April 29 2020 23:01:20 EDT Ventricular Rate:  79 PR Interval:  165 QRS Duration: 102 QT Interval:  402 QTC Calculation: 461 R Axis:   91 Text Interpretation: Sinus rhythm Borderline right axis deviation Low voltage, precordial leads Nonspecific T abnrm, anterolateral leads Confirmed by Geoffery Lyons ([id number]) on 04/29/2020 11:04:46 PM   Radiology No results found.  Procedures Reduction of fracture  Date/Time: 04/30/2020 1:13 AM Performed by: Geoffery Lyons, MD Authorized by: Geoffery Lyons, MD  Consent: Verbal consent obtained. Written consent obtained. Risks and benefits: risks, benefits and alternatives were discussed Consent given by: patient Patient understanding: patient states understanding of the procedure being performed Patient consent: the patient's understanding of the procedure matches consent given Procedure consent: procedure consent matches procedure scheduled Relevant documents: relevant documents present and verified Test results: test results available and properly labeled Site marked: the operative site was marked Imaging studies: imaging studies available Required items: required blood products, implants, devices, and special equipment available Patient identity confirmed: verbally with  patient and arm band Time out: Immediately prior to procedure a "time out" was called to verify the correct patient, procedure, equipment, support staff and site/side marked as required. Preparation: Patient was prepped and draped in the usual sterile fashion. Local anesthesia used: no  Anesthesia: Local anesthesia used: no  Sedation: Patient sedated: yes Sedation type: moderate (conscious) sedation Sedatives: propofol Sedation start date/time: 04/30/2020 12:33 AM Sedation end date/time: 04/30/2020 12:45 AM  Patient tolerance: patient tolerated the procedure well with no immediate complications  .Sedation  Date/Time: 04/30/2020 1:14 AM Performed by: Geoffery Lyons, MD Authorized by: Geoffery Lyons, MD   Consent:    Consent obtained:  Written and verbal   Consent given by:  Patient   Risks discussed:  Allergic reaction, dysrhythmia, inadequate sedation and respiratory compromise necessitating ventilatory  assistance and intubation Universal protocol:    Procedure explained and questions answered to patient or proxy's satisfaction: yes     Relevant documents present and verified: yes     Test results available: yes     Imaging studies available: yes     Required blood products, implants, devices, and special equipment available: yes     Site/side marked: yes     Immediately prior to procedure, a time out was called: yes   Indications:    Procedure performed:  Fracture reduction   Procedure necessitating sedation performed by:  Physician performing sedation Pre-sedation assessment:    Time since last food or drink:  4 hours   ASA classification: class 3 - patient with severe systemic disease     Mallampati score:  I - soft palate, uvula, fauces, pillars visible   Neck mobility: normal     Pre-sedation assessments completed and reviewed: pre-procedure airway patency not reviewed   Immediate pre-procedure details:    Reviewed: vital signs and relevant labs/tests   Procedure details  (see MAR for exact dosages):    Preoxygenation:  Nasal cannula   Sedation:  Etomidate   Intended level of sedation: deep and moderate (conscious sedation)   Intra-procedure monitoring:  Blood pressure monitoring, cardiac monitor, continuous capnometry, continuous pulse oximetry, frequent LOC assessments and frequent vital sign checks   Intra-procedure events: none     Total Provider sedation time (minutes):  15 Post-procedure details:    Post-sedation assessments completed and reviewed: post-procedure airway patency not reviewed, post-procedure cardiovascular function not reviewed, post-procedure mental status not reviewed and post-procedure respiratory function not reviewed     Procedure completion:  Tolerated well, no immediate complications Comments:     Patient tolerated procedure well with no complications.     Medications Ordered in ED Medications  ondansetron (ZOFRAN) injection 4 mg (has no administration in time range)  morphine 4 MG/ML injection 4 mg (has no administration in time range)    ED Course  I have reviewed the triage vital signs and the nursing notes.  Pertinent labs & imaging results that were available during my care of the patient were reviewed by me and considered in my medical decision making (see chart for details).    MDM Rules/Calculators/A&P  Patient presenting here with complaints of a fall.  She has been having weakness in her legs recently and this evening caused her to fall to the ground, injuring her right ankle and bumping her head.  She is neurologically intact with stable vital signs and negative head CT.  Laboratory studies reflective of perhaps mild dehydration, but no other abnormality found.  Patient did have blood pressures occasionally in the 90s, but responded to IV fluids.  Her ankle was reduced under conscious sedation and placed in a posterior splint with stirrup (see procedure note).  The foot was neurovascularly intact pre and post  reduction with good pulses and sensation.  Patient's injury discussed with Dr. Dallas Schimke from orthopedic surgery.  He is comfortable with the patient going home and following up in the office for surgical repair once the swelling goes down.  She will be discharged with pain medicine, nonweightbearing, elevation, ice, and follow-up as recommended.  Final Clinical Impression(s) / ED Diagnoses Final diagnoses:  None    Rx / DC Orders ED Discharge Orders    None       Geoffery Lyons, MD 04/30/20 [id number]    Geoffery Lyons, MD 04/30/20 [phone]

## 2020-04-30 ENCOUNTER — Other Ambulatory Visit: Payer: Self-pay

## 2020-04-30 ENCOUNTER — Ambulatory Visit (INDEPENDENT_AMBULATORY_CARE_PROVIDER_SITE_OTHER): Payer: BC Managed Care – PPO | Admitting: Orthopedic Surgery

## 2020-04-30 ENCOUNTER — Emergency Department (HOSPITAL_COMMUNITY): Payer: BC Managed Care – PPO

## 2020-04-30 ENCOUNTER — Encounter: Payer: Self-pay | Admitting: Orthopedic Surgery

## 2020-04-30 VITALS — BP 143/75 | HR 86 | Ht 66.0 in | Wt 184.0 lb

## 2020-04-30 DIAGNOSIS — W19XXXA Unspecified fall, initial encounter: Secondary | ICD-10-CM

## 2020-04-30 DIAGNOSIS — S82851A Displaced trimalleolar fracture of right lower leg, initial encounter for closed fracture: Secondary | ICD-10-CM

## 2020-04-30 LAB — TROPONIN I (HIGH SENSITIVITY): Troponin I (High Sensitivity): 4 ng/L (ref <18)

## 2020-04-30 LAB — COMPREHENSIVE METABOLIC PANEL
ALT: 26 U/L (ref 0–44)
AST: 35 U/L (ref 15–41)
Albumin: 3.2 g/dL — ABNORMAL LOW (ref 3.5–5.0)
Alkaline Phosphatase: 58 U/L (ref 38–126)
Anion gap: 14 (ref 5–15)
BUN: 19 mg/dL (ref 6–20)
CO2: 17 mmol/L — ABNORMAL LOW (ref 22–32)
Calcium: 7.7 mg/dL — ABNORMAL LOW (ref 8.9–10.3)
Chloride: 101 mmol/L (ref 98–111)
Creatinine, Ser: 1.22 mg/dL — ABNORMAL HIGH (ref 0.44–1.00)
GFR, Estimated: 51 mL/min — ABNORMAL LOW (ref >60)
Glucose, Bld: 86 mg/dL (ref 70–99)
Potassium: 3.4 mmol/L — ABNORMAL LOW (ref 3.5–5.1)
Sodium: 132 mmol/L — ABNORMAL LOW (ref 135–145)
Total Bilirubin: 0.5 mg/dL (ref 0.3–1.2)
Total Protein: 6.7 g/dL (ref 6.5–8.1)

## 2020-04-30 MED ORDER — PROPOFOL 10 MG/ML IV BOLUS
INTRAVENOUS | Status: AC | PRN
  Administered 2020-04-30: 20 mg via INTRAVENOUS
  Administered 2020-04-30 (×2): 40 mg via INTRAVENOUS

## 2020-04-30 MED ORDER — OXYCODONE-ACETAMINOPHEN 5-325 MG PO TABS: 1.0000 | ORAL_TABLET | Freq: Four times a day (QID) | ORAL | 0 refills | Status: DC | PRN

## 2020-04-30 MED ORDER — PROPOFOL 10 MG/ML IV BOLUS
INTRAVENOUS | Status: AC
  Filled 2020-04-30: qty 40

## 2020-04-30 MED ORDER — OXYCODONE-ACETAMINOPHEN 5-325 MG PO TABS: 2.0000 | ORAL_TABLET | ORAL | 0 refills | Status: DC | PRN

## 2020-04-30 NOTE — H&P (View-Only) (Signed)
New Patient Visit  Assessment: Kimberly Mahoney is a 60 y.o. female with the following: Closed trimalleolar fracture of right ankle, initial encounter  Plan: Patient sustained a right trimalleolar ankle fracture dislocation, that was reduced in the emergency department yesterday.  Post reduction x-rays demonstrated excellent alignment.  However, given the severity of her injury, I recommended operative intervention.  Risks and benefits of surgery, including, but not limited to infection, bleeding, persistent pain, damage to surrounding structures, need for further surgery, malunion, nonunion, hardware failure, stiffness and more severe complications associated with anesthesia were discussed.  All questions have been answered and they have elected to proceed with surgery.   She is at an increased risk for perioperative complications given her cardiac history.  In addition, she does smoke occasionally, and is therefore at increased risk for poor healing and an infection following surgery.  She is also been taking narcotic pain medication for other chronic pain, and we may therefore have some difficulty controlling her pain following surgery.  We will plan to proceed with operative fixation as an outpatient on May 11, 2020.  The postoperative course was discussed with the patient.  She is aware that she will be nonweightbearing for 6 to 8 weeks following surgery.  She is also aware that this could take upwards of 6 months or more before she is fully ambulatory without pain.  The hospital will contact her prior to surgery to schedule preoperative evaluation and to obtain preoperative labs and a Covid test.  Questions were answered, and she is amenable this plan.  She was given some narcotic pain medication from the emergency department, and I have stressed to her the importance of weaning her off of this pain medication so that we can provide adequate pain relief following surgery.  Surgical  Plan  Procedure: Operative fixation of right trimalleolar ankle fracture Disposition: Outpatient Anesthesia: Nerve block with sedation Medical Comorbidities: COPD, heart disease, recent MI, history of smoking. Notes: Surgery will require lateral and medial fixation.   Follow-up: Return for After surgery; 10-14 days post, DOS 4/25.  Subjective:  Chief Complaint  Patient presents with  . Ankle Injury    Rt ankle DOI 04/29/20    History of Present Illness: Kimberly Mahoney is a 60 y.o. female who presents for evaluation of a right ankle injury.  She states that she has been having some weakness, as well as lightheadedness recently.  She was walking earlier in the day yesterday, when she fell due to the weakness and feeling dizzy.  She did hit her head.  She presented to the emergency department for evaluation.  CT scans of her head were normal.  However, she did have a dislocated right ankle fracture.  This was reduced under conscious sedation, and placed in a splint appropriately.  She presents today for further discussion regarding treatment.  Her pain has been tolerable.  She has not picked up her narcotic prescription provided from the emergency department.  No numbness or tingling in her foot.  She is unable to walk with crutches, and has been relying on a walker and a wheelchair.  When she is up moving around, she does note that there is more throbbing in her ankle.   Review of Systems: No fevers or chills No numbness or tingling No chest pain No bowel or bladder dysfunction No GI distress No headaches   Medical History:  Past Medical History:  Diagnosis Date  . CHF (congestive heart failure) (HCC)   .  COPD (chronic obstructive pulmonary disease) (HCC)   . Heart attack (HCC)   . Hypertension   . Interstitial cystitis   . Lupus [facility])     Past Surgical History:  Procedure Laterality Date  . ABDOMINAL HYSTERECTOMY    . CESAREAN SECTION    . CHOLECYSTECTOMY    . LEG  SURGERY Left     No family history on file. Social History   Tobacco Use  . Smoking status: Current Every Day Smoker  . Smokeless tobacco: Never Used  Substance Use Topics  . Alcohol use: Yes    Comment: socially  . Drug use: No    Allergies  Allergen Reactions  . Lisinopril     Current Meds  Medication Sig  . amitriptyline (ELAVIL) 50 MG tablet Take 100 mg by mouth at bedtime.  Marland Kitchen atorvastatin (LIPITOR) 40 MG tablet Take 40 mg by mouth daily.  . diclofenac (VOLTAREN) 75 MG EC tablet Take 75 mg by mouth 2 (two) times daily.  . furosemide (LASIX) 40 MG tablet Take 40 mg by mouth.  Marland Kitchen HYDROcodone-acetaminophen (NORCO/VICODIN) 5-325 MG per tablet Take 1 tablet by mouth every 6 (six) hours as needed. pain  . hydroxychloroquine (PLAQUENIL) 200 MG tablet Take by mouth daily.  . isosorbide-hydrALAZINE (BIDIL) 20-37.5 MG tablet Take by mouth 3 (three) times daily.  Marland Kitchen levothyroxine (SYNTHROID) 112 MCG tablet Take 112 mcg by mouth daily before breakfast.  . metoprolol succinate (TOPROL-XL) 25 MG 24 hr tablet   . oxyCODONE-acetaminophen (PERCOCET) 5-325 MG tablet Take 2 tablets by mouth every 4 (four) hours as needed.  Marland Kitchen oxyCODONE-acetaminophen (PERCOCET) 5-325 MG tablet Take 1-2 tablets by mouth every 6 (six) hours as needed.  . potassium chloride (KLOR-CON) 10 MEQ tablet Take 10 mEq by mouth 2 (two) times daily.  Marland Kitchen umeclidinium-vilanterol (ANORO ELLIPTA) 62.5-25 MCG/INH AEPB Use as directed 1 puff in the mouth or throat daily.    Objective: BP (!) 143/75   Pulse 86   Ht 5\' 6"  (1.676 m)   Wt 184 lb (83.5 kg)   BMI 29.70 kg/m   Physical Exam:  General: Alert and oriented.  No acute distress. Gait: Unable to ambulate due to right ankle fracture.  Patient of the right lower extremity demonstrates a splint that is clean, dry and intact.  There is no skin breakdown at the proximal extent of the splint.  Exposed toes are warm and well-perfused.  There does appear to be some bruising  in her foot.  She is able to wiggle her exposed toes.  She has full sensation in the exposed aspect of the right foot.  IMAGING: I personally reviewed images previously obtained from the ED   X-rays of the right ankle were obtained prior to reduction and these demonstrates a distal fibula fracture, as well as a small medial malleolus fracture.  The ankle joint is dislocated.  There is a small posterior mall fragment.  Postreduction x-rays demonstrates excellent restoration of the ankle joint.   New Medications:  No orders of the defined types were placed in this encounter.     , MD  04/30/2020 11:52 AM

## 2020-04-30 NOTE — Patient Instructions (Signed)
Plan for OR 4/25, time TBD

## 2020-04-30 NOTE — Discharge Instructions (Signed)
Nonweightbearing until followed up by orthopedics.  Keep your leg elevated as much as possible for the next several days.  Ice for 20 minutes every 2 hours while awake for the next 3 days.  Take Percocet as prescribed as needed for pain.  You are to follow-up with Dr. Dallas Schimke, the orthopedic surgeon in the next 3 to 4 days.  His contact information has been provided on this discharge summary for you to call and make these arrangements.  Return to the emergency department if you develop increasing pain, numbness, or other new and concerning symptoms.

## 2020-04-30 NOTE — Progress Notes (Signed)
New Patient Visit  Assessment: Kimberly Mahoney is a 60 y.o. female with the following: Closed trimalleolar fracture of right ankle, initial encounter  Plan: Patient sustained a right trimalleolar ankle fracture dislocation, that was reduced in the emergency department yesterday.  Post reduction x-rays demonstrated excellent alignment.  However, given the severity of her injury, I recommended operative intervention.  Risks and benefits of surgery, including, but not limited to infection, bleeding, persistent pain, damage to surrounding structures, need for further surgery, malunion, nonunion, hardware failure, stiffness and more severe complications associated with anesthesia were discussed.  All questions have been answered and they have elected to proceed with surgery.   She is at an increased risk for perioperative complications given her cardiac history.  In addition, she does smoke occasionally, and is therefore at increased risk for poor healing and an infection following surgery.  She is also been taking narcotic pain medication for other chronic pain, and we may therefore have some difficulty controlling her pain following surgery.  We will plan to proceed with operative fixation as an outpatient on May 11, 2020.  The postoperative course was discussed with the patient.  She is aware that she will be nonweightbearing for 6 to 8 weeks following surgery.  She is also aware that this could take upwards of 6 months or more before she is fully ambulatory without pain.  The hospital will contact her prior to surgery to schedule preoperative evaluation and to obtain preoperative labs and a Covid test.  Questions were answered, and she is amenable this plan.  She was given some narcotic pain medication from the emergency department, and I have stressed to her the importance of weaning her off of this pain medication so that we can provide adequate pain relief following surgery.  Surgical  Plan  Procedure: Operative fixation of right trimalleolar ankle fracture Disposition: Outpatient Anesthesia: Nerve block with sedation Medical Comorbidities: COPD, heart disease, recent MI, history of smoking. Notes: Surgery will require lateral and medial fixation.   Follow-up: Return for After surgery; 10-14 days post, DOS 4/25.  Subjective:  Chief Complaint  Patient presents with  . Ankle Injury    Rt ankle DOI 04/29/20    History of Present Illness: Kimberly Mahoney is a 60 y.o. female who presents for evaluation of a right ankle injury.  She states that she has been having some weakness, as well as lightheadedness recently.  She was walking earlier in the day yesterday, when she fell due to the weakness and feeling dizzy.  She did hit her head.  She presented to the emergency department for evaluation.  CT scans of her head were normal.  However, she did have a dislocated right ankle fracture.  This was reduced under conscious sedation, and placed in a splint appropriately.  She presents today for further discussion regarding treatment.  Her pain has been tolerable.  She has not picked up her narcotic prescription provided from the emergency department.  No numbness or tingling in her foot.  She is unable to walk with crutches, and has been relying on a walker and a wheelchair.  When she is up moving around, she does note that there is more throbbing in her ankle.   Review of Systems: No fevers or chills No numbness or tingling No chest pain No bowel or bladder dysfunction No GI distress No headaches   Medical History:  Past Medical History:  Diagnosis Date  . CHF (congestive heart failure) (HCC)   .  COPD (chronic obstructive pulmonary disease) (HCC)   . Heart attack (HCC)   . Hypertension   . Interstitial cystitis   . Lupus (HCC)     Past Surgical History:  Procedure Laterality Date  . ABDOMINAL HYSTERECTOMY    . CESAREAN SECTION    . CHOLECYSTECTOMY    . LEG  SURGERY Left     No family history on file. Social History   Tobacco Use  . Smoking status: Current Every Day Smoker  . Smokeless tobacco: Never Used  Substance Use Topics  . Alcohol use: Yes    Comment: socially  . Drug use: No    Allergies  Allergen Reactions  . Lisinopril     Current Meds  Medication Sig  . amitriptyline (ELAVIL) 50 MG tablet Take 100 mg by mouth at bedtime.  . atorvastatin (LIPITOR) 40 MG tablet Take 40 mg by mouth daily.  . diclofenac (VOLTAREN) 75 MG EC tablet Take 75 mg by mouth 2 (two) times daily.  . furosemide (LASIX) 40 MG tablet Take 40 mg by mouth.  . HYDROcodone-acetaminophen (NORCO/VICODIN) 5-325 MG per tablet Take 1 tablet by mouth every 6 (six) hours as needed. pain  . hydroxychloroquine (PLAQUENIL) 200 MG tablet Take by mouth daily.  . isosorbide-hydrALAZINE (BIDIL) 20-37.5 MG tablet Take by mouth 3 (three) times daily.  . levothyroxine (SYNTHROID) 112 MCG tablet Take 112 mcg by mouth daily before breakfast.  . metoprolol succinate (TOPROL-XL) 25 MG 24 hr tablet   . oxyCODONE-acetaminophen (PERCOCET) 5-325 MG tablet Take 2 tablets by mouth every 4 (four) hours as needed.  . oxyCODONE-acetaminophen (PERCOCET) 5-325 MG tablet Take 1-2 tablets by mouth every 6 (six) hours as needed.  . potassium chloride (KLOR-CON) 10 MEQ tablet Take 10 mEq by mouth 2 (two) times daily.  . umeclidinium-vilanterol (ANORO ELLIPTA) 62.5-25 MCG/INH AEPB Use as directed 1 puff in the mouth or throat daily.    Objective: BP (!) 143/75   Pulse 86   Ht 5' 6" (1.676 m)   Wt 184 lb (83.5 kg)   BMI 29.70 kg/m   Physical Exam:  General: Alert and oriented.  No acute distress. Gait: Unable to ambulate due to right ankle fracture.  Patient of the right lower extremity demonstrates a splint that is clean, dry and intact.  There is no skin breakdown at the proximal extent of the splint.  Exposed toes are warm and well-perfused.  There does appear to be some bruising  in her foot.  She is able to wiggle her exposed toes.  She has full sensation in the exposed aspect of the right foot.  IMAGING: I personally reviewed images previously obtained from the ED   X-rays of the right ankle were obtained prior to reduction and these demonstrates a distal fibula fracture, as well as a small medial malleolus fracture.  The ankle joint is dislocated.  There is a small posterior mall fragment.  Postreduction x-rays demonstrates excellent restoration of the ankle joint.   New Medications:  No orders of the defined types were placed in this encounter.     Kennedee Kitzmiller A Raekwon Winkowski, MD  04/30/2020 11:52 AM    

## 2020-05-04 MED FILL — Oxycodone w/ Acetaminophen Tab 5-325 MG: ORAL | Qty: 6 | Status: AC

## 2020-05-05 ENCOUNTER — Telehealth: Payer: Self-pay | Admitting: Orthopaedic Surgery

## 2020-05-05 MED ORDER — OXYCODONE-ACETAMINOPHEN 5-325 MG PO TABS: 1.0000 | ORAL_TABLET | Freq: Four times a day (QID) | ORAL | 0 refills | Status: DC | PRN

## 2020-05-05 NOTE — Telephone Encounter (Signed)
Patient called to request refill of medication - aware Dr Dallas Schimke is out of clinic and that our other provider(s) are reviewing refill requests: oxyCODONE-acetaminophen (PERCOCET) 5-325 MG tablet 20 tablet  [facility] [place]

## 2020-05-05 NOTE — Patient Instructions (Signed)
Kimberly Mahoney  05/05/2020     @PREFPERIOPPHARMACY @   Your procedure is scheduled on  05/11/2020.   Report to 05/13/2020 at  [phone]  A.M.   Call this number if you have problems the morning of surgery:  [phone]   Remember:  Do not eat or drink after midnight.                         Take these medicines the morning of surgery with A SIP OF WATER  Diclofenac, plaquenil, bidil, levothyroxine, metoprolol.  Use your inhaler before you come and bring your rescue inhaler with you.  Place clean sheets on your bed the night before your procedure and DO NOT sleep with pets this night.  Wash off with CHG wipes the night before and the morning of your procedure. DO NOT use CHG on your face, hair or genitals.  There are 2 wipes in each package. Use one wipe to wipe the visible skin on your right leg. DO NOT loosen bandages or remove any dressings. Use the other wipe on your torso, arms and left leg. DO this the night before and the morning of your procedure.  After each wipe down, put on clean comfortable clothe and brush your teeth.      Do not wear jewelry, make-up or nail polish.  Do not wear lotions, powders, or perfumes, or deodorant.  Do not shave 48 hours prior to surgery.  Men may shave face and neck.  Do not bring valuables to the hospital.  [facility] is not responsible for any belongings or valuables.   Contacts, dentures or bridgework may not be worn into surgery.  Leave your suitcase in the car.  After surgery it may be brought to your room.  For patients admitted to the hospital, discharge time will be determined by your treatment team.  Patients discharged the day of surgery will not be allowed to drive home and must have someone with them for 24 hours.    Special instructions:  DO NOT smoke tobacco or vape for 24 hours before your procedure.   Please read over the following fact sheets that you were given. Coughing and Deep Breathing, Surgical Site  Infection Prevention, Anesthesia Post-op Instructions and Care and Recovery After Surgery       Displaced Bimalleolar Ankle Fracture Treated With ORIF, Care After This sheet gives you information about how to care for yourself after your procedure. Your health care provider may also give you more specific instructions. If you have problems or questions, contact your health care provider. What can I expect after the procedure? After the procedure, it is common to have:  Pain.  Swelling.  A small amount of fluid from your incision. Follow these instructions at home: Medicines  Take over-the-counter and prescription medicines only as told by your health care provider.  Ask your health care provider if the medicine prescribed to you: ? Requires you to avoid driving or using machinery. ? Can cause constipation. You may need to take these actions to prevent or treat constipation:  Drink enough fluid to keep your urine pale yellow.  Take over-the-counter or prescription medicines.  Eat foods that are high in fiber, such as beans, whole grains, and fresh fruits and vegetables.  Limit foods that are high in fat and processed sugars, such as fried or sweet foods. If you have a splint or boot:  Wear the splint  or boot as told by your health care provider. Remove it only as told by your health care provider.  Loosen the splint or boot if your toes tingle, become numb, or turn cold and blue.  Keep the splint or boot clean. If you have a cast:  Do not stick anything inside the cast to scratch your skin. Doing that increases your risk of infection.  Check the skin around the cast every day. Tell your health care provider about any concerns.  You may put lotion on dry skin around the edges of the cast. Do not put lotion on the skin underneath the cast.  Keep the cast clean. Bathing  Do not take baths, swim, or use a hot tub until your health care provider approves. Ask your health  care provider if you may take showers. You may only be allowed to take sponge baths.  If your splint, boot, or cast is not waterproof: ? Do not let it get wet. ? Cover it with a watertight covering when you take a bath or a shower.  Keep the bandage (dressing) dry until your health care provider says it can be removed. Incision care  Follow instructions from your health care provider about how to take care of your incision. Make sure you: ? Wash your hands with soap and water for at least 20 seconds before and after you change your dressing. If soap and water are not available, use hand sanitizer. ? Change your dressing as told by your health care provider. ? Leave stitches (sutures), skin glue, or adhesive strips in place. These skin closures may need to stay in place for 2 weeks or longer. If adhesive strip edges start to loosen and curl up, you may trim the loose edges. Do not remove adhesive strips completely unless your health care provider tells you to do that.  Check your incision area every day for signs of infection. Check for: ? Redness. ? More pain or more swelling. ? Blood or more fluid. ? Warmth. ? Pus or a bad smell.   Managing pain, stiffness, and swelling  If directed, put ice on the affected area. To do this: ? If you have a removable splint or boot, remove it as told by your health care provider. ? Put ice in a plastic bag. ? Place a towel between your skin and the bag, or between your cast and the bag. ? Leave the ice on for 20 minutes, 2-3 times a day. ? Remove the ice if your skin turns bright red. This is very important. If you cannot feel pain, heat, or cold, you have a greater risk of damage to the area.  Move your toes often to reduce stiffness and swelling.  Raise (elevate) the injured area above the level of your heart while you are sitting or lying down. To do this, try putting a few pillows under your leg and ankle.   Activity  Do not use your injured  limb to support (bear) your body weight until your health care provider says that you can. Follow weight-bearing restrictions as told. Use crutches or other assistive devicesto help you move around as told by your health care provider.  Ask your health care provider when it is safe to drive if you have a splint, boot, or cast on your foot.  Do exercises as told by your health care provider or physical therapist.  Return to your normal activities as told by your health care provider. Ask your health care  provider what activities are safe for you. General instructions  Do not put pressure on any part of the splint or cast until it is fully hardened, if applicable. This may take several hours.  Do not use any products that contain nicotine or tobacco, such as cigarettes, e-cigarettes, and chewing tobacco. These can delay bone healing. If you need help quitting, ask your health care provider.  Keep all follow-up visits. This is important. Contact a health care provider if:  You have a fever.  Your pain medicine is not helping.  You have redness around your incision.  You have more swelling or severe pain around your incision.  You have more fluid or blood coming from your incision or leaking through your cast.  Your incision feels warm to the touch.  You have pus or a bad smell coming from your incision or from your cast or dressing. Get help right away if:  The edges of your incision come apart after the stitches or staples have been taken out.  You have chest pain.  You have trouble breathing.  Your foot or leg feels numb or tingles.  Your foot becomes cold, pale, or blue.  You have calf swelling or tenderness. These symptoms may represent a serious problem that is an emergency. Do not wait to see if the symptoms will go away. Get medical help right away. Call your local emergency services (911 in the [place] Do not drive yourself to the hospital. Summary  After the  procedure, it is common to have pain and swelling.  If your splint, boot, or cast is not waterproof, do not let it get wet.  Contact your health care provider if you have more swelling or severe pain, or if you have more fluids coming from your incision or leaking through your cast.  Get help right away if you have numbness or tingling in your foot or leg, or if your foot becomes cold, pale, or blue. This information is not intended to replace advice given to you by your health care provider. Make sure you discuss any questions you have with your health care provider. Document Revised: 04/08/2019 Document Reviewed: 04/08/2019 Elsevier Patient Education  2021 [employer] General Anesthesia, Adult, Care After This sheet gives you information about how to care for yourself after your procedure. Your health care provider may also give you more specific instructions. If you have problems or questions, contact your health care provider. What can I expect after the procedure? After the procedure, the following side effects are common:  Pain or discomfort at the IV site.  Nausea.  Vomiting.  Sore throat.  Trouble concentrating.  Feeling cold or chills.  Feeling weak or tired.  Sleepiness and fatigue.  Soreness and body aches. These side effects can affect parts of the body that were not involved in surgery. Follow these instructions at home: For the time period you were told by your health care provider:  Rest.  Do not participate in activities where you could fall or become injured.  Do not drive or use machinery.  Do not drink alcohol.  Do not take sleeping pills or medicines that cause drowsiness.  Do not make important decisions or sign legal documents.  Do not take care of children on your own.   Eating and drinking  Follow any instructions from your health care provider about eating or drinking restrictions.  When you feel hungry, start by eating small amounts of  foods that are soft and easy to  digest (bland), such as toast. Gradually return to your regular diet.  Drink enough fluid to keep your urine pale yellow.  If you vomit, rehydrate by drinking water, juice, or clear broth. General instructions  If you have sleep apnea, surgery and certain medicines can increase your risk for breathing problems. Follow instructions from your health care provider about wearing your sleep device: ? Anytime you are sleeping, including during daytime naps. ? While taking prescription pain medicines, sleeping medicines, or medicines that make you drowsy.  Have a responsible adult stay with you for the time you are told. It is important to have someone help care for you until you are awake and alert.  Return to your normal activities as told by your health care provider. Ask your health care provider what activities are safe for you.  Take over-the-counter and prescription medicines only as told by your health care provider.  If you smoke, do not smoke without supervision.  Keep all follow-up visits as told by your health care provider. This is important. Contact a health care provider if:  You have nausea or vomiting that does not get better with medicine.  You cannot eat or drink without vomiting.  You have pain that does not get better with medicine.  You are unable to pass urine.  You develop a skin rash.  You have a fever.  You have redness around your IV site that gets worse. Get help right away if:  You have difficulty breathing.  You have chest pain.  You have blood in your urine or stool, or you vomit blood. Summary  After the procedure, it is common to have a sore throat or nausea. It is also common to feel tired.  Have a responsible adult stay with you for the time you are told. It is important to have someone help care for you until you are awake and alert.  When you feel hungry, start by eating small amounts of foods that are soft  and easy to digest (bland), such as toast. Gradually return to your regular diet.  Drink enough fluid to keep your urine pale yellow.  Return to your normal activities as told by your health care provider. Ask your health care provider what activities are safe for you. This information is not intended to replace advice given to you by your health care provider. Make sure you discuss any questions you have with your health care provider. Document Revised: 09/19/2019 Document Reviewed: 04/18/2019 Elsevier Patient Education  2021 [employer].

## 2020-05-08 ENCOUNTER — Other Ambulatory Visit: Payer: Self-pay

## 2020-05-08 ENCOUNTER — Other Ambulatory Visit (HOSPITAL_COMMUNITY)
Admission: RE | Admit: 2020-05-08 | Discharge: 2020-05-08 | Disposition: A | Discharge status: Home / Self Care | Payer: BC Managed Care – PPO | Source: Ambulatory Visit | Attending: Orthopedic Surgery | Admitting: Orthopedic Surgery

## 2020-05-08 ENCOUNTER — Encounter (HOSPITAL_COMMUNITY)
Admission: RE | Admit: 2020-05-08 | Discharge: 2020-05-08 | Disposition: A | Discharge status: Home / Self Care | Payer: BC Managed Care – PPO | Source: Ambulatory Visit | Attending: Orthopedic Surgery | Admitting: Orthopedic Surgery

## 2020-05-08 ENCOUNTER — Encounter (HOSPITAL_COMMUNITY): Payer: Self-pay

## 2020-05-08 DIAGNOSIS — Z79899 Other long term (current) drug therapy: Secondary | ICD-10-CM | POA: Diagnosis not present

## 2020-05-08 DIAGNOSIS — W19XXXA Unspecified fall, initial encounter: Secondary | ICD-10-CM | POA: Diagnosis not present

## 2020-05-08 DIAGNOSIS — Z20822 Contact with and (suspected) exposure to covid-19: Secondary | ICD-10-CM | POA: Insufficient documentation

## 2020-05-08 DIAGNOSIS — Z01812 Encounter for preprocedural laboratory examination: Secondary | ICD-10-CM | POA: Insufficient documentation

## 2020-05-08 DIAGNOSIS — Z90710 Acquired absence of both cervix and uterus: Secondary | ICD-10-CM | POA: Diagnosis not present

## 2020-05-08 DIAGNOSIS — M329 Systemic lupus erythematosus, unspecified: Secondary | ICD-10-CM | POA: Diagnosis not present

## 2020-05-08 DIAGNOSIS — S82851A Displaced trimalleolar fracture of right lower leg, initial encounter for closed fracture: Secondary | ICD-10-CM | POA: Diagnosis not present

## 2020-05-08 DIAGNOSIS — I509 Heart failure, unspecified: Secondary | ICD-10-CM | POA: Diagnosis not present

## 2020-05-08 DIAGNOSIS — F17200 Nicotine dependence, unspecified, uncomplicated: Secondary | ICD-10-CM | POA: Diagnosis not present

## 2020-05-08 DIAGNOSIS — Z791 Long term (current) use of non-steroidal anti-inflammatories (NSAID): Secondary | ICD-10-CM | POA: Diagnosis not present

## 2020-05-08 DIAGNOSIS — Z9049 Acquired absence of other specified parts of digestive tract: Secondary | ICD-10-CM | POA: Diagnosis not present

## 2020-05-08 DIAGNOSIS — Y9301 Activity, walking, marching and hiking: Secondary | ICD-10-CM | POA: Diagnosis not present

## 2020-05-08 DIAGNOSIS — I252 Old myocardial infarction: Secondary | ICD-10-CM | POA: Diagnosis not present

## 2020-05-08 DIAGNOSIS — Z888 Allergy status to other drugs, medicaments and biological substances status: Secondary | ICD-10-CM | POA: Diagnosis not present

## 2020-05-08 DIAGNOSIS — I110 Hypertensive heart disease with heart failure: Secondary | ICD-10-CM | POA: Diagnosis not present

## 2020-05-08 DIAGNOSIS — Z8719 Personal history of other diseases of the digestive system: Secondary | ICD-10-CM | POA: Diagnosis not present

## 2020-05-08 DIAGNOSIS — Z79890 Hormone replacement therapy (postmenopausal): Secondary | ICD-10-CM | POA: Diagnosis not present

## 2020-05-08 HISTORY — DX: Unspecified osteoarthritis, unspecified site: M19.90

## 2020-05-08 HISTORY — DX: Hypothyroidism, unspecified: E03.9

## 2020-05-08 HISTORY — DX: Chronic kidney disease, unspecified: N18.9

## 2020-05-09 LAB — SARS CORONAVIRUS 2 (TAT 6-24 HRS): SARS Coronavirus 2: NEGATIVE

## 2020-05-11 ENCOUNTER — Encounter (HOSPITAL_COMMUNITY): Payer: Self-pay | Admitting: Orthopedic Surgery

## 2020-05-11 ENCOUNTER — Ambulatory Visit (HOSPITAL_COMMUNITY): Payer: BC Managed Care – PPO | Admitting: Certified Registered"

## 2020-05-11 ENCOUNTER — Ambulatory Visit (HOSPITAL_COMMUNITY): Payer: BC Managed Care – PPO

## 2020-05-11 ENCOUNTER — Encounter (HOSPITAL_COMMUNITY)
Admission: RE | Disposition: A | Discharge status: Home / Self Care | Payer: Self-pay | Source: Home / Self Care | Attending: Orthopedic Surgery

## 2020-05-11 ENCOUNTER — Ambulatory Visit (HOSPITAL_COMMUNITY)
Admission: RE | Admit: 2020-05-11 | Discharge: 2020-05-11 | Disposition: A | Discharge status: Home / Self Care | Payer: BC Managed Care – PPO | Attending: Orthopedic Surgery | Admitting: Orthopedic Surgery

## 2020-05-11 DIAGNOSIS — S82851A Displaced trimalleolar fracture of right lower leg, initial encounter for closed fracture: Secondary | ICD-10-CM | POA: Insufficient documentation

## 2020-05-11 DIAGNOSIS — Z9889 Other specified postprocedural states: Secondary | ICD-10-CM

## 2020-05-11 DIAGNOSIS — Z888 Allergy status to other drugs, medicaments and biological substances status: Secondary | ICD-10-CM | POA: Insufficient documentation

## 2020-05-11 DIAGNOSIS — Z79890 Hormone replacement therapy (postmenopausal): Secondary | ICD-10-CM | POA: Insufficient documentation

## 2020-05-11 DIAGNOSIS — I110 Hypertensive heart disease with heart failure: Secondary | ICD-10-CM | POA: Insufficient documentation

## 2020-05-11 DIAGNOSIS — W19XXXA Unspecified fall, initial encounter: Secondary | ICD-10-CM | POA: Insufficient documentation

## 2020-05-11 DIAGNOSIS — Z79899 Other long term (current) drug therapy: Secondary | ICD-10-CM | POA: Insufficient documentation

## 2020-05-11 DIAGNOSIS — M329 Systemic lupus erythematosus, unspecified: Secondary | ICD-10-CM | POA: Insufficient documentation

## 2020-05-11 DIAGNOSIS — Y9301 Activity, walking, marching and hiking: Secondary | ICD-10-CM | POA: Insufficient documentation

## 2020-05-11 DIAGNOSIS — F17200 Nicotine dependence, unspecified, uncomplicated: Secondary | ICD-10-CM | POA: Insufficient documentation

## 2020-05-11 DIAGNOSIS — Z20822 Contact with and (suspected) exposure to covid-19: Secondary | ICD-10-CM | POA: Insufficient documentation

## 2020-05-11 DIAGNOSIS — S82891A Other fracture of right lower leg, initial encounter for closed fracture: Secondary | ICD-10-CM

## 2020-05-11 DIAGNOSIS — I509 Heart failure, unspecified: Secondary | ICD-10-CM | POA: Insufficient documentation

## 2020-05-11 DIAGNOSIS — Z8719 Personal history of other diseases of the digestive system: Secondary | ICD-10-CM | POA: Insufficient documentation

## 2020-05-11 DIAGNOSIS — Z8781 Personal history of (healed) traumatic fracture: Secondary | ICD-10-CM

## 2020-05-11 DIAGNOSIS — S82851D Displaced trimalleolar fracture of right lower leg, subsequent encounter for closed fracture with routine healing: Secondary | ICD-10-CM

## 2020-05-11 DIAGNOSIS — Z791 Long term (current) use of non-steroidal anti-inflammatories (NSAID): Secondary | ICD-10-CM | POA: Insufficient documentation

## 2020-05-11 DIAGNOSIS — I252 Old myocardial infarction: Secondary | ICD-10-CM | POA: Insufficient documentation

## 2020-05-11 DIAGNOSIS — Z9049 Acquired absence of other specified parts of digestive tract: Secondary | ICD-10-CM | POA: Insufficient documentation

## 2020-05-11 DIAGNOSIS — Z90710 Acquired absence of both cervix and uterus: Secondary | ICD-10-CM | POA: Insufficient documentation

## 2020-05-11 HISTORY — PX: ORIF ANKLE FRACTURE: SHX5408

## 2020-05-11 SURGERY — OPEN REDUCTION INTERNAL FIXATION (ORIF) ANKLE FRACTURE
Anesthesia: General | Site: Ankle | Laterality: Right

## 2020-05-11 MED ORDER — MIDAZOLAM HCL 2 MG/2ML IJ SOLN
INTRAMUSCULAR | Status: AC
  Filled 2020-05-11: qty 2

## 2020-05-11 MED ORDER — BUPIVACAINE-EPINEPHRINE (PF) 0.25% -1:200000 IJ SOLN
INTRAMUSCULAR | Status: AC
  Filled 2020-05-11: qty 30

## 2020-05-11 MED ORDER — MIDAZOLAM HCL 2 MG/2ML IJ SOLN
2.0000 mg | Freq: Once | INTRAMUSCULAR | Status: AC
  Administered 2020-05-11: 2 mg via INTRAVENOUS

## 2020-05-11 MED ORDER — PHENYLEPHRINE 40 MCG/ML (10ML) SYRINGE FOR IV PUSH (FOR BLOOD PRESSURE SUPPORT)
PREFILLED_SYRINGE | INTRAVENOUS | Status: AC
  Filled 2020-05-11: qty 10

## 2020-05-11 MED ORDER — DEXAMETHASONE SODIUM PHOSPHATE 4 MG/ML IJ SOLN
INTRAMUSCULAR | Status: DC | PRN
  Administered 2020-05-11 (×2): 4 mg via PERINEURAL

## 2020-05-11 MED ORDER — PROPOFOL 10 MG/ML IV BOLUS
INTRAVENOUS | Status: AC
  Filled 2020-05-11: qty 40

## 2020-05-11 MED ORDER — ONDANSETRON HCL 4 MG PO TABS: 4.0000 mg | ORAL_TABLET | Freq: Three times a day (TID) | ORAL | 0 refills | Status: AC | PRN

## 2020-05-11 MED ORDER — BUPIVACAINE HCL (PF) 0.5 % IJ SOLN
INTRAMUSCULAR | Status: DC | PRN
  Administered 2020-05-11: 14 mL via PERINEURAL

## 2020-05-11 MED ORDER — ASPIRIN EC 81 MG PO TBEC: 81.0000 mg | DELAYED_RELEASE_TABLET | Freq: Two times a day (BID) | ORAL | 0 refills | Status: AC

## 2020-05-11 MED ORDER — BUPIVACAINE HCL (PF) 0.5 % IJ SOLN
INTRAMUSCULAR | Status: AC
  Filled 2020-05-11: qty 30

## 2020-05-11 MED ORDER — MEPERIDINE HCL 50 MG/ML IJ SOLN: 6.2500 mg | INTRAMUSCULAR | Status: DC | PRN

## 2020-05-11 MED ORDER — ROCURONIUM BROMIDE 10 MG/ML (PF) SYRINGE
PREFILLED_SYRINGE | INTRAVENOUS | Status: AC
  Filled 2020-05-11: qty 10

## 2020-05-11 MED ORDER — ORAL CARE MOUTH RINSE: 15.0000 mL | Freq: Once | OROMUCOSAL | Status: AC

## 2020-05-11 MED ORDER — LACTATED RINGERS IV SOLN
INTRAVENOUS | Status: DC
  Administered 2020-05-11: 1000 mL via INTRAVENOUS

## 2020-05-11 MED ORDER — ROPIVACAINE HCL 5 MG/ML IJ SOLN
INTRAMUSCULAR | Status: AC
  Filled 2020-05-11: qty 30

## 2020-05-11 MED ORDER — DEXMEDETOMIDINE (PRECEDEX) IN NS 20 MCG/5ML (4 MCG/ML) IV SYRINGE
PREFILLED_SYRINGE | INTRAVENOUS | Status: AC
  Filled 2020-05-11: qty 5

## 2020-05-11 MED ORDER — DEXAMETHASONE SODIUM PHOSPHATE 10 MG/ML IJ SOLN
INTRAMUSCULAR | Status: AC
  Filled 2020-05-11: qty 1

## 2020-05-11 MED ORDER — FENTANYL CITRATE (PF) 100 MCG/2ML IJ SOLN
INTRAMUSCULAR | Status: AC
  Administered 2020-05-11: 50 ug via INTRAVENOUS
  Filled 2020-05-11: qty 2

## 2020-05-11 MED ORDER — DEXAMETHASONE SODIUM PHOSPHATE 10 MG/ML IJ SOLN
INTRAMUSCULAR | Status: DC | PRN
  Administered 2020-05-11: 10 mg via INTRAVENOUS

## 2020-05-11 MED ORDER — PROPOFOL 10 MG/ML IV BOLUS
INTRAVENOUS | Status: DC | PRN
  Administered 2020-05-11: 200 mg via INTRAVENOUS

## 2020-05-11 MED ORDER — ONDANSETRON HCL 4 MG/2ML IJ SOLN: 4.0000 mg | Freq: Once | INTRAMUSCULAR | Status: DC | PRN

## 2020-05-11 MED ORDER — LIDOCAINE HCL (PF) 1 % IJ SOLN
INTRAMUSCULAR | Status: DC | PRN
  Administered 2020-05-11 (×2): 3 mL

## 2020-05-11 MED ORDER — FENTANYL CITRATE (PF) 100 MCG/2ML IJ SOLN: 100.0000 ug | Freq: Once | INTRAMUSCULAR | Status: AC

## 2020-05-11 MED ORDER — OXYCODONE HCL 5 MG PO TABS: 5.0000 mg | ORAL_TABLET | ORAL | 0 refills | Status: DC | PRN

## 2020-05-11 MED ORDER — MIDAZOLAM HCL 5 MG/5ML IJ SOLN
INTRAMUSCULAR | Status: DC | PRN
  Administered 2020-05-11: 2 mg via INTRAVENOUS

## 2020-05-11 MED ORDER — 0.9 % SODIUM CHLORIDE (POUR BTL) OPTIME
TOPICAL | Status: DC | PRN
  Administered 2020-05-11 (×2): 1000 mL

## 2020-05-11 MED ORDER — FENTANYL CITRATE (PF) 100 MCG/2ML IJ SOLN
INTRAMUSCULAR | Status: AC
  Filled 2020-05-11: qty 2

## 2020-05-11 MED ORDER — CEFAZOLIN SODIUM-DEXTROSE 2-4 GM/100ML-% IV SOLN
2.0000 g | INTRAVENOUS | Status: AC
  Administered 2020-05-11: 2 g via INTRAVENOUS

## 2020-05-11 MED ORDER — DEXAMETHASONE SODIUM PHOSPHATE 4 MG/ML IJ SOLN
INTRAMUSCULAR | Status: AC
  Filled 2020-05-11: qty 2

## 2020-05-11 MED ORDER — HYDROMORPHONE HCL 1 MG/ML IJ SOLN: 0.2500 mg | INTRAMUSCULAR | Status: DC | PRN

## 2020-05-11 MED ORDER — BUPIVACAINE-EPINEPHRINE (PF) 0.25% -1:200000 IJ SOLN
INTRAMUSCULAR | Status: DC | PRN
  Administered 2020-05-11 (×2): 15 mL via PERINEURAL

## 2020-05-11 MED ORDER — ACETAMINOPHEN 500 MG PO TABS: 1000.0000 mg | ORAL_TABLET | Freq: Three times a day (TID) | ORAL | 0 refills | Status: AC

## 2020-05-11 MED ORDER — BUPIVACAINE-EPINEPHRINE (PF) 0.5% -1:200000 IJ SOLN
INTRAMUSCULAR | Status: DC | PRN
  Administered 2020-05-11: 20 mL via PERINEURAL

## 2020-05-11 MED ORDER — CHLORHEXIDINE GLUCONATE 0.12 % MT SOLN
OROMUCOSAL | Status: AC
  Filled 2020-05-11: qty 15

## 2020-05-11 MED ORDER — DEXMEDETOMIDINE (PRECEDEX) IN NS 20 MCG/5ML (4 MCG/ML) IV SYRINGE
PREFILLED_SYRINGE | INTRAVENOUS | Status: DC | PRN
  Administered 2020-05-11: 20 ug via INTRAVENOUS

## 2020-05-11 MED ORDER — ONDANSETRON HCL 4 MG/2ML IJ SOLN
INTRAMUSCULAR | Status: AC
  Filled 2020-05-11: qty 2

## 2020-05-11 MED ORDER — CEFAZOLIN SODIUM-DEXTROSE 2-4 GM/100ML-% IV SOLN
INTRAVENOUS | Status: AC
  Filled 2020-05-11: qty 100

## 2020-05-11 MED ORDER — FENTANYL CITRATE (PF) 100 MCG/2ML IJ SOLN
INTRAMUSCULAR | Status: DC | PRN
  Administered 2020-05-11 (×2): 100 ug via INTRAVENOUS

## 2020-05-11 MED ORDER — LIDOCAINE HCL (PF) 1 % IJ SOLN
INTRAMUSCULAR | Status: AC
  Filled 2020-05-11: qty 30

## 2020-05-11 MED ORDER — BUPIVACAINE LIPOSOME 1.3 % IJ SUSP
INTRAMUSCULAR | Status: AC
  Filled 2020-05-11: qty 20

## 2020-05-11 MED ORDER — CHLORHEXIDINE GLUCONATE 0.12 % MT SOLN
15.0000 mL | Freq: Once | OROMUCOSAL | Status: AC
  Administered 2020-05-11: 15 mL via OROMUCOSAL

## 2020-05-11 SURGICAL SUPPLY — 72 items
APL PRP STRL LF DISP 70% ISPRP (MISCELLANEOUS) ×1
BANDAGE ESMARK 4X12 BL STRL LF (DISPOSABLE) ×1 IMPLANT
BIT DRILL 3.5X122MM AO FIT (BIT) ×2 IMPLANT
BIT DRILL CANN 2.7 (BIT) ×2
BIT DRILL SRG 2.7XCANN AO CPLG (BIT) ×1 IMPLANT
BIT DRL SRG 2.7XCANN AO CPLNG (BIT) ×1
BLADE SURG SZ10 CARB STEEL (BLADE) ×2 IMPLANT
BNDG CMPR 12X4 ELC STRL LF (DISPOSABLE) ×1
BNDG ELASTIC 4X5.8 VLCR STR LF (GAUZE/BANDAGES/DRESSINGS) ×4 IMPLANT
BNDG ESMARK 4X12 BLUE STRL LF (DISPOSABLE) ×2
CHLORAPREP W/TINT 26 (MISCELLANEOUS) ×2 IMPLANT
CLOTH BEACON ORANGE TIMEOUT ST (SAFETY) ×2 IMPLANT
COVER LIGHT HANDLE STERIS (MISCELLANEOUS) ×4 IMPLANT
COVER MAYO STAND XLG (MISCELLANEOUS) ×2 IMPLANT
COVER WAND RF STERILE (DRAPES) ×2 IMPLANT
CUFF TOURN SGL QUICK 34 (TOURNIQUET CUFF) ×2
CUFF TRNQT CYL 34X4.125X (TOURNIQUET CUFF) ×1 IMPLANT
DECANTER SPIKE VIAL GLASS SM (MISCELLANEOUS) ×2 IMPLANT
DRAPE C-ARM FOLDED MOBILE STRL (DRAPES) ×2 IMPLANT
DRAPE U-SHAPE 47X51 STRL (DRAPES) ×2 IMPLANT
DRILL 2.6X122MM WL AO SHAFT (BIT) ×2 IMPLANT
ELECT REM PT RETURN 9FT ADLT (ELECTROSURGICAL) ×2
ELECTRODE REM PT RTRN 9FT ADLT (ELECTROSURGICAL) ×1 IMPLANT
GAUZE SPONGE 4X4 12PLY STRL (GAUZE/BANDAGES/DRESSINGS) ×2 IMPLANT
GAUZE XEROFORM 1X8 LF (GAUZE/BANDAGES/DRESSINGS) ×4 IMPLANT
GLOVE SKINSENSE NS SZ8.0 LF (GLOVE) ×2
GLOVE SKINSENSE STRL SZ8.0 LF (GLOVE) ×2 IMPLANT
GLOVE SRG 8 PF TXTR STRL LF DI (GLOVE) ×1 IMPLANT
GLOVE SURG UNDER POLY LF SZ7 (GLOVE) ×6 IMPLANT
GLOVE SURG UNDER POLY LF SZ8 (GLOVE) ×2
GOWN STRL REUS W/ TWL XL LVL3 (GOWN DISPOSABLE) ×1 IMPLANT
GOWN STRL REUS W/TWL LRG LVL3 (GOWN DISPOSABLE) ×4 IMPLANT
GOWN STRL REUS W/TWL XL LVL3 (GOWN DISPOSABLE) ×2
INST SET MINOR BONE (KITS) ×2 IMPLANT
K-WIRE 1.4X100 (WIRE) ×4
K-WIRE 1.6X150 (WIRE) ×6
K-WIRE FX150X1.6XKRSH (WIRE) ×3
KIT TURNOVER KIT A (KITS) ×2 IMPLANT
KWIRE 1.4X100 (WIRE) ×2 IMPLANT
KWIRE FX150X1.6XKRSH (WIRE) ×3 IMPLANT
LOOP VESSEL MAXI BLUE (MISCELLANEOUS) ×2 IMPLANT
MANIFOLD NEPTUNE II (INSTRUMENTS) ×2 IMPLANT
NEEDLE HYPO 18GX1.5 BLUNT FILL (NEEDLE) ×2 IMPLANT
NEEDLE HYPO 21X1.5 SAFETY (NEEDLE) ×2 IMPLANT
NS IRRIG 1000ML POUR BTL (IV SOLUTION) ×4 IMPLANT
PACK BASIC LIMB (CUSTOM PROCEDURE TRAY) ×2 IMPLANT
PAD ABD 5X9 TENDERSORB (GAUZE/BANDAGES/DRESSINGS) ×8 IMPLANT
PAD ARMBOARD 7.5X6 YLW CONV (MISCELLANEOUS) ×4 IMPLANT
PAD CAST 4YDX4 CTTN HI CHSV (CAST SUPPLIES) ×2 IMPLANT
PADDING CAST COTTON 4X4 STRL (CAST SUPPLIES) ×4
PENCIL SMOKE EVACUATOR (MISCELLANEOUS) ×2 IMPLANT
PLATE FIBULA 5H (Plate) ×2 IMPLANT
SCREW 50X4.0MM (Screw) ×4 IMPLANT
SCREW BONE 14MMX3.5MM (Screw) ×6 IMPLANT
SCREW BONE 18 (Screw) ×6 IMPLANT
SCREW BONE 3.5X20MM (Screw) ×2 IMPLANT
SET BASIN LINEN APH (SET/KITS/TRAYS/PACK) ×2 IMPLANT
SPLINT PLASTER CAST XFAST 5X30 (CAST SUPPLIES) ×1 IMPLANT
SPLINT PLASTER XFAST SET 5X30 (CAST SUPPLIES) ×1
SPONGE LAP 18X18 RF (DISPOSABLE) ×2 IMPLANT
SUT ETHILON 3 0 FSL (SUTURE) ×2 IMPLANT
SUT ETHILON 3 0 PS 1 (SUTURE) ×6 IMPLANT
SUT MNCRL AB 4-0 PS2 18 (SUTURE) ×2 IMPLANT
SUT MON AB 0 CT1 (SUTURE) IMPLANT
SUT MON AB 2-0 CT1 36 (SUTURE) ×2 IMPLANT
SUT VIC AB 2-0 CT1 27 (SUTURE) ×2
SUT VIC AB 2-0 CT1 TAPERPNT 27 (SUTURE) ×1 IMPLANT
SYR 20ML LL LF (SYRINGE) ×2 IMPLANT
SYR 30ML LL (SYRINGE) IMPLANT
SYR BULB IRRIG 60ML STRL (SYRINGE) ×4 IMPLANT
WATER STERILE IRR 1000ML POUR (IV SOLUTION) ×2 IMPLANT
WATER STERILE IRR 500ML POUR (IV SOLUTION) ×2 IMPLANT

## 2020-05-11 NOTE — Anesthesia Postprocedure Evaluation (Signed)
Anesthesia Post Note  Patient: Kimberly Mahoney  Procedure(s) Performed: RIGHT OPEN REDUCTION INTERNAL FIXATION (ORIF) ANKLE FRACTURE (Right Ankle)  Patient location during evaluation: PACU Anesthesia Type: General Level of consciousness: awake and alert and oriented Pain management: pain level controlled Vital Signs Assessment: post-procedure vital signs reviewed and stable Respiratory status: spontaneous breathing and respiratory function stable Cardiovascular status: blood pressure returned to baseline and stable Postop Assessment: no apparent nausea or vomiting Anesthetic complications: no   No complications documented.   Last Vitals:  Vitals:   05/11/20 1245 05/11/20 1302  BP: 126/63 137/88  Pulse: 83 84  Resp: 17 18  Temp:  36.6 C  SpO2: 92% 95%    Last Pain:  Vitals:   05/11/20 1302  TempSrc: Oral  PainSc: 0-No pain                 Khyron Garno C Violet Seabury

## 2020-05-11 NOTE — Anesthesia Procedure Notes (Signed)
Anesthesia Regional Block: Popliteal block   Pre-Anesthetic Checklist: ,, timeout performed, Correct Patient, Correct Site, Correct Laterality, Correct Procedure, Correct Position, site marked, Risks and benefits discussed,  Surgical consent,  Pre-op evaluation,  At surgeon's request and post-op pain management  Laterality: Right  Prep: chloraprep       Needles:  Injection technique: Single-shot  Needle Type: Echogenic Stimulator Needle     Needle Length: 10cm  Needle Gauge: 20   Needle insertion depth: 6 cm   Additional Needles:   Procedures:,,,, ultrasound used (permanent image in chart),,,,  Narrative:  Start time: 05/11/2020 8:37 AM End time: 05/11/2020 8:46 AM Injection made incrementally with aspirations every 5 mL.  Performed by: Personally  Anesthesiologist: Molli Barrows, MD  Additional Notes: BP cuff, EKG monitors applied. Sedation begun. After nerve location anesthetic injected incrementally, slowly , and after neg aspirations. Tolerated well.

## 2020-05-11 NOTE — Discharge Instructions (Signed)
Mark A. Dallas Schimke, MD MS [facility] [address] [place],  [place]  [address] Phone: [phone] Fax: [phone]   POST-OPERATIVE INSTRUCTIONS - LOWER EXTREMITY   WOUND CARE ? Please keep splint clean dry and intact until followup.  ? You may shower on Post-Op Day #2.  ? You must keep splint dry during this process and may find that a plastic bag taped around the leg or alternatively a towel based bath may be a better option.   ? If you get your splint wet or if it is damaged please contact our clinic.  EXERCISES ? Due to your splint being in place you will not be able to bear weight through your extremity.   ? DO NOT PUT ANY WEIGHT ON YOUR OPERATIVE LEG ? Please use crutches or a walker to avoid weight bearing.   REGIONAL ANESTHESIA (NERVE BLOCKS) . The anesthesia team may have performed a nerve block for you if safe in the setting of your care.  This is a great tool used to minimize pain.  Typically the block may start wearing off overnight but the long acting medicine may last for 3-4 days.  The nerve block wearing off can be a challenging period but please utilize your as needed pain medications to try and manage this period.    POST-OP MEDICATIONS- Multimodal approach to pain control . In general your pain will be controlled with a combination of substances.  Prescriptions unless otherwise discussed are electronically sent to your pharmacy.  This is a carefully made plan we use to minimize narcotic use.     - Acetaminophen - Non-narcotic pain medicine taken on a scheduled basis   - Oxycodone - This is a strong narcotic, to be used only on an "as needed" basis for pain.  -  Aspirin 81mg  - This medicine is used to minimize the risk of blood clots after surgery.             -          Zofran - take as needed for nausea   FOLLOW-UP ? If you develop a Fever (>101.5), Redness or Drainage from the surgical incision site, please call our office to arrange  for an evaluation. ? Please call the office to schedule a follow-up appointment for your incision check if you do not already have one, 10-14 days post-operatively.  IF YOU HAVE ANY QUESTIONS, PLEASE FEEL FREE TO CALL OUR OFFICE.  HELPFUL INFORMATION  ? If you had a block, it will wear off between 8-24 hrs postop typically.  This is period when your pain may go from nearly zero to the pain you would have had postop without the block.  This is an abrupt transition but nothing dangerous is happening.  You may take an extra dose of narcotic when this happens.  ? You should wean off your narcotic medicines as soon as you are able.  Most patients will be off or using minimal narcotics before their first postop appointment.   ? Elevating your leg will help with swelling and pain control.  You are encouraged to elevate your leg as much as possible in the first couple of weeks following surgery.  Imagine a drop of water on your toe, and your goal is to get that water back to your heart.  ? We suggest you use the pain medication the first night prior to going to bed, in order to ease any pain when the anesthesia wears  off. You should avoid taking pain medications on an empty stomach as it will make you nauseous.  ? Do not drink alcoholic beverages or take illicit drugs when taking pain medications.  ? In most states it is against the law to drive while you are in a splint or sling.  And certainly against the law to drive while taking narcotics.  ? You may return to work/school in the next couple of days when you feel up to it.   ? Pain medication may make you constipated.  Below are a few solutions to try in this order: - Decrease the amount of pain medication if you aren't having pain. - Drink lots of decaffeinated fluids. - Drink prune juice and/or each dried prunes  o If the first 3 don't work start with additional solutions - Take Colace - an over-the-counter stool softener - Take Senokot - an  over-the-counter laxative - Take Miralax - a stronger over-the-counter laxative      General Anesthesia, Adult, Care After This sheet gives you information about how to care for yourself after your procedure. Your health care provider may also give you more specific instructions. If you have problems or questions, contact your health care provider. What can I expect after the procedure? After the procedure, the following side effects are common:  Pain or discomfort at the IV site.  Nausea.  Vomiting.  Sore throat.  Trouble concentrating.  Feeling cold or chills.  Feeling weak or tired.  Sleepiness and fatigue.  Soreness and body aches. These side effects can affect parts of the body that were not involved in surgery. Follow these instructions at home: For the time period you were told by your health care provider:  Rest.  Do not participate in activities where you could fall or become injured.  Do not drive or use machinery.  Do not drink alcohol.  Do not take sleeping pills or medicines that cause drowsiness.  Do not make important decisions or sign legal documents.  Do not take care of children on your own.   Eating and drinking  Follow any instructions from your health care provider about eating or drinking restrictions.  When you feel hungry, start by eating small amounts of foods that are soft and easy to digest (bland), such as toast. Gradually return to your regular diet.  Drink enough fluid to keep your urine pale yellow.  If you vomit, rehydrate by drinking water, juice, or clear broth. General instructions  If you have sleep apnea, surgery and certain medicines can increase your risk for breathing problems. Follow instructions from your health care provider about wearing your sleep device: ? Anytime you are sleeping, including during daytime naps. ? While taking prescription pain medicines, sleeping medicines, or medicines that make you  drowsy.  Have a responsible adult stay with you for the time you are told. It is important to have someone help care for you until you are awake and alert.  Return to your normal activities as told by your health care provider. Ask your health care provider what activities are safe for you.  Take over-the-counter and prescription medicines only as told by your health care provider.  If you smoke, do not smoke without supervision.  Keep all follow-up visits as told by your health care provider. This is important. Contact a health care provider if:  You have nausea or vomiting that does not get better with medicine.  You cannot eat or drink without vomiting.  You have  pain that does not get better with medicine.  You are unable to pass urine.  You develop a skin rash.  You have a fever.  You have redness around your IV site that gets worse. Get help right away if:  You have difficulty breathing.  You have chest pain.  You have blood in your urine or stool, or you vomit blood. Summary  After the procedure, it is common to have a sore throat or nausea. It is also common to feel tired.  Have a responsible adult stay with you for the time you are told. It is important to have someone help care for you until you are awake and alert.  When you feel hungry, start by eating small amounts of foods that are soft and easy to digest (bland), such as toast. Gradually return to your regular diet.  Drink enough fluid to keep your urine pale yellow.  Return to your normal activities as told by your health care provider. Ask your health care provider what activities are safe for you. This information is not intended to replace advice given to you by your health care provider. Make sure you discuss any questions you have with your health care provider. Document Revised: 09/19/2019 Document Reviewed: 04/18/2019 Elsevier Patient Education  2021 [employer].

## 2020-05-11 NOTE — Anesthesia Procedure Notes (Signed)
Procedure Name: LMA Insertion Performed by: Shanon Payor, CRNA Pre-anesthesia Checklist: Patient identified, Emergency Drugs available, Suction available, Patient being monitored and Timeout performed Patient Re-evaluated:Patient Re-evaluated prior to induction Oxygen Delivery Method: Circle system utilized Preoxygenation: Pre-oxygenation with 100% oxygen Induction Type: IV induction LMA: LMA inserted LMA Size: 4.0 Number of attempts: 1 Placement Confirmation: positive ETCO2 and breath sounds checked- equal and bilateral Tube secured with: Tape Dental Injury: Teeth and Oropharynx as per pre-operative assessment

## 2020-05-11 NOTE — Transfer of Care (Signed)
Immediate Anesthesia Transfer of Care Note  Patient: Kimberly Mahoney  Procedure(s) Performed: RIGHT OPEN REDUCTION INTERNAL FIXATION (ORIF) ANKLE FRACTURE (Right Ankle)  Patient Location: PACU  Anesthesia Type:General  Level of Consciousness: awake, alert , oriented, drowsy and patient cooperative  Airway & Oxygen Therapy: Patient Spontanous Breathing and Patient connected to face mask oxygen  Post-op Assessment: Report given to RN, Post -op Vital signs reviewed and stable and Patient moving all extremities X 4  Post vital signs: Reviewed and stable  Last Vitals:  Vitals Value Taken Time  BP    Temp    Pulse 73 05/11/20 1202  Resp 14 05/11/20 1202  SpO2 99 % 05/11/20 1202  Vitals shown include unvalidated device data.  Last Pain:  Vitals:   05/11/20 0741  TempSrc: Oral  PainSc: 8       Patients Stated Pain Goal: 6 (05/11/20 0741)  Complications: No complications documented.

## 2020-05-11 NOTE — Anesthesia Procedure Notes (Signed)
Anesthesia Regional Block: Adductor canal block   Pre-Anesthetic Checklist: ,, timeout performed, Correct Patient, Correct Site, Correct Laterality, Correct Procedure, Correct Position, site marked, Risks and benefits discussed, pre-op evaluation,  At surgeon's request and post-op pain management  Laterality: Right  Prep: chloraprep       Needles:  Injection technique: Single-shot  Needle Type: Echogenic Stimulator Needle     Needle Length: 8.3cm  Needle Gauge: 22   Needle insertion depth: 6 cm   Additional Needles:   Procedures:,,,, ultrasound used (permanent image in chart),,,,  Narrative:  Start time: 05/11/2020 8:47 AM End time: 05/11/2020 8:52 AM  Performed by: Personally  Anesthesiologist: Molli Barrows, MD  Additional Notes: BP cuff, EKG monitors applied. Sedation begun. After nerve location anesthetic injected incrementally, slowly , and after neg aspirations. Tolerated well.

## 2020-05-11 NOTE — Op Note (Signed)
Orthopaedic Surgery Operative Note (CSN: [id number])  Kimberly Mahoney  [birth year] Date of Surgery: 05/11/2020   Diagnoses:  Right trimalleolar ankle fracture  Procedure: Operative fixation of distal fibula fracture and medial malleolus fracture    Operative Finding Successful completion of the planned procedure.  Lag screw fixation with neutralization plate laterally and 2 partially threaded cannulated screws in the medial malleolus fragment.  Small, less than 25% posterior malleolus fragment, minimally displaced was left without fixation.  Syndesmosis was stressed and deemed to be stable.     Post-Op Diagnosis: Same Surgeons:Primary: [name], MD Assistants: Cecile Sheerer Location: [facility] OR ROOM 4 Anesthesia: General with regional anesthesia Antibiotics: Ancef 2 g Tourniquet time:  Total Tourniquet Time Documented: Thigh (Right) - 112 minutes Total: Thigh (Right) - 112 minutes  Estimated Blood Loss: 25 cc Complications: None Specimens: None Implants: Implant Name Type Inv. Item Serial No. Manufacturer Lot No. LRB No. Used Action  SCREW BONE 3.5X20MM - SSTERILE FROM SPD Screw SCREW BONE 3.5X20MM STERILE FROM SPD [employer]  Right 1 Implanted  SCREW BONE 14MMX3. - SSTERILE FROM SPD Screw SCREW BONE 14MMX3. STERILE FROM [employer]  Right 3 Implanted  SCREW BONE 18 - SSTERILE FROM SPD Screw SCREW BONE 18 STERILE FROM [employer]  Right 3 Implanted  SCREW 50X4. - SSTERILE FROM SPD Screw SCREW 50X4. STERILE FROM [employer]  Right 2 Implanted  PLATE FIBULA 5H - SSTERILE FROM SPD Plate PLATE FIBULA 5H STERILE FROM [employer]  Right 1 Implanted    Indications for Surgery:   Kimberly Mahoney is a 60 y.o. female who sustained a right trimalleolar ankle fracture dislocation that was reduced in the ED.  She presented to clinic for further discussion regarding her injury, and I recommended operative fixation.  Benefits and  risks of operative and nonoperative management were discussed prior to surgery with patient and informed consent form was completed.  Specific risks including infection, need for additional surgery, bleeding, poor wound and fracture healing, persistent pain, stiffness, nonunion, malunion, damage to surrounding structures and more severe complications associated with anesthesia were discussed.  She elected to proceed.    Procedure:   The patient was identified properly. Informed consent was obtained and the surgical site was marked. The patient was taken to the OR where general anesthesia was induced.  The patient was positioned supine, with a bump under her right hip.  The right ankle was prepped and draped in the usual sterile fashion.  Timeout was performed before the beginning of the case.  Tourniquet was used for the above duration.  She received appropriate dosing of ancef prior to starting the procedure.   We started by making a longitudinal incision centered over the fracture at the distal fibula.  We incised sharply through skin and then dissected bluntly with metzenbaum scissors.  Directly overlying the fracture site, the superficial peroneal nerve was crossing from posterior to the distal fibula across to the dorsum of the foot.  The nerve was subsequently released and protected throughout the case.  A vessel loop was secured around the nerve.   We then identified the fracture which was a long oblique fracture with a thin cortical fragment extending posteriorly.  Callus was removed from the fracture site.  We used a combination of rongeur and irrigation to clean out the fracture site.  We then used 2 reduction clamps to reduce the fracture.  Fluoroscopic images confirmed excellent reduction.  Due to the oblique  nature of the fracture we planned to place a lag screw by technique.  From posterolateral to anteromedial we drilled to the first cortex with a 3.5 mm screw and then the medial cortex with  a 2.6 mm drill.  A 3.5 mm screw was then placed and achieved excellent purchase.  Fracture reduction clamps were removed the reduction was maintained.  We then selected an appropriately sized plate from the back table and secured it to the lateral fibula.  The plate was provisionally fixed to the fibula and the placement was confirmed under fluoroscopy.  A single screw was placed distal to the fracture, and then proximal to the fracture to secure the plate.  Fluoroscopic images confirmed appropriate placement of the plate and an excellent reduction of the fracture.  We then placed 2 additional bicortical screws proximal and distal to the fracture.     Next, we turned our attention to the medial malleolus fracture.  We made an curvilinear incision extending distally beyond the tip of the medial malleolus.  Blunt dissection was carried through subcutaneous tissue to the fracture site.  The saphenous nerve and vein were protected throughout this portion of the case.  The fracture was identified and cleaned with a rongeur and irrigation.  A point to point clamp was used to reduce and secure the fracture.  This confirmed under fluoroscopy.  A threaded K wire was introduced across the fracture site.  Fluoroscopy confirmed placement of the K wire, and determined there was sufficient room for a screw and another K wire.  A second K wire was introduced.  We used a cannulated drill bit to drill the cortex and determined a 50 mm screw was sufficient.  A partially threaded 3.5 mm cannulated screw was introduced, achieving excellent compression at the fracture site. A second partially threaded screw was then placed across the fracture site.  AP and lateral fluoroscopic images demonstrated appropriate placement of the screws, with excellent reduction of the fracture.  Next, the syndesmosis was stressed and deemed to be stable.  We also noted a posterior malleolus fracture that was minimally displaced and less than 25% of the  entire joint line.  Due to the limited displacement and no instability of the syndesmosis, no further fixation was necessary.  Final orthogonal images on fluoroscopy demonstrated excellent reduction of the medial and lateral malleolus fractures without medial clear space widening or syndesmotic instability.    We irrigated the wound.  We closed the incision using 3-0 nylon.  The superfical nerve was placed back in its regular position and protected throughout closure.  The nerve did not appear to be bruised and remained in continuity.  It was however, in line with the plate. A sterile dressing was placed followed by a well padded splint.  The patient was awoken taken to PACU in stable condition.  Post-operative plan:  The patient will be NWB on the RLE.   DVT prophylaxis Aspirin 81 mg twice daily for 6 weeks.    Pain control with PRN pain medication preferring oral medicines.   Follow up plan will be scheduled in approximately 10-14 days for incision check and XR.

## 2020-05-11 NOTE — Anesthesia Preprocedure Evaluation (Signed)
Anesthesia Evaluation  Patient identified by MRN, date of birth, ID band Patient awake    Reviewed: Allergy & Precautions, NPO status , Patient's Chart, lab work & pertinent test results  Airway Mallampati: II  TM Distance: >3 FB Neck ROM: Full    Dental  (+) Dental Advisory Given   Pulmonary COPD, Current Smoker and Patient abstained from smoking.,    Pulmonary exam normal breath sounds clear to auscultation       Cardiovascular hypertension, Pt. on medications + Past MI and +CHF  Normal cardiovascular exam Rhythm:Regular Rate:Normal     Neuro/Psych    GI/Hepatic Neg liver ROS, GERD  Medicated and Controlled,  Endo/Other  Hypothyroidism   Renal/GU Renal disease     Musculoskeletal  (+) Arthritis , RIGHT ANKLE FX   Abdominal   Peds  Hematology   Anesthesia Other Findings   Reproductive/Obstetrics                           Anesthesia Physical Anesthesia Plan  ASA: III  Anesthesia Plan: General   Post-op Pain Management:  Regional for Post-op pain   Induction: Intravenous  PONV Risk Score and Plan: Dexamethasone, Ondansetron and Midazolam  Airway Management Planned: LMA  Additional Equipment:   Intra-op Plan:   Post-operative Plan: Extubation in OR  Informed Consent: I have reviewed the patients History and Physical, chart, labs and discussed the procedure including the risks, benefits and alternatives for the proposed anesthesia with the patient or authorized representative who has indicated his/her understanding and acceptance.     Dental advisory given  Plan Discussed with: CRNA and Surgeon  Anesthesia Plan Comments:        Anesthesia Quick Evaluation

## 2020-05-11 NOTE — Interval H&P Note (Signed)
History and Physical Interval Note:  05/11/2020 8:08 AM  Kimberly Mahoney  has presented today for surgery, with the diagnosis of Right trimalleolar ankle fracture.  The various methods of treatment have been discussed with the patient and family. After consideration of risks, benefits and other options for treatment, the patient has consented to  Procedure(s) with comments: RIGHT OPEN REDUCTION INTERNAL FIXATION (ORIF) ANKLE FRACTURE (Right) - Right trimalleolar ankle fracture  as a surgical intervention.  The patient's history has been reviewed, patient examined, no change in status, stable for surgery.  I have reviewed the patient's chart and labs.  Questions were answered to the patient's satisfaction.    Her pain has been controlled.  She has noticed the splint is feeling loose since she was seen in clinic.  No additional issues noted.  Evaluated the front of her ankle which demonstrates improved swelling, wrinkles are present.     [name]

## 2020-05-12 ENCOUNTER — Encounter (HOSPITAL_COMMUNITY): Payer: Self-pay | Admitting: Orthopedic Surgery

## 2020-05-18 ENCOUNTER — Other Ambulatory Visit: Payer: Self-pay | Admitting: Orthopedic Surgery

## 2020-05-18 NOTE — Telephone Encounter (Signed)
Patient called requesting a refill on her pain medicine.  She states she had surgery on 05/11/20.   oxyCODONE (ROXICODONE) 5 MG immediate release tablet  Pharmacy [facility] in [place]

## 2020-05-18 NOTE — Telephone Encounter (Signed)
I am unable to electronically sign this prescription right now.  I have tried multiple times and the computer will not finalize the prescription.   Thanks [employer]

## 2020-05-19 MED ORDER — OXYCODONE HCL 5 MG PO TABS: 5.0000 mg | ORAL_TABLET | ORAL | 0 refills | Status: AC | PRN

## 2020-05-26 ENCOUNTER — Ambulatory Visit: Payer: Self-pay

## 2020-05-26 ENCOUNTER — Other Ambulatory Visit: Payer: Self-pay

## 2020-05-26 ENCOUNTER — Ambulatory Visit (INDEPENDENT_AMBULATORY_CARE_PROVIDER_SITE_OTHER): Payer: 59 | Admitting: Orthopedic Surgery

## 2020-05-26 ENCOUNTER — Encounter: Payer: Self-pay | Admitting: Orthopedic Surgery

## 2020-05-26 DIAGNOSIS — S82851A Displaced trimalleolar fracture of right lower leg, initial encounter for closed fracture: Secondary | ICD-10-CM

## 2020-05-26 DIAGNOSIS — S82851D Displaced trimalleolar fracture of right lower leg, subsequent encounter for closed fracture with routine healing: Secondary | ICD-10-CM

## 2020-05-26 NOTE — Progress Notes (Signed)
Orthopaedic Postop Note  Assessment: Kimberly Mahoney is a 60 y.o. female s/p ORIF of Right trimalleolar ankle fracture with medial and lateral fixation; posterior mal <25% of articular surface, no fixation  DOS: 05/11/2020  Plan: Sutures removed, steri strips placed Well padded short leg cast placed in clinic today NWB on the operative extremity Continue to take Aspirin 81 mg BID Pain medications as needed; encouraged her to wean off narcotics Heart burn and indigestion likely related to constipation associated with narcotic use Colace BID while taking oxycodone; miralax as needed   Cast application - right short leg cast   Verbal consent was obtained and the correct extremity was identified. A well padded, appropriately molded short leg cast was applied to the right leg Toes remained warm and well perfused.   There were no sharp edges Patient tolerated the procedure well Cast care instructions were provided     Follow-up: Return in about 2 weeks (around 06/09/2020). XR at next visit: Right ankle; out of cast  Subjective:  Chief Complaint  Patient presents with  . Follow-up    Postop right ankle DOS 05/11/20    History of Present Illness: Kimberly Mahoney is a 60 y.o. female who presents following the above stated procedure.  She is doing well overall.  She continues to take oxycodone, but she is spacing out the duration.  She has some residual pain in the ankle, but feels well otherwise.  Little bit of tingling over the dorsal forefoot.  She reports that she is not passing gas on a consistent basis, and is having regular indigestion.  She is not taking anything currently for constipation.  Review of Systems: No fevers or chills + tingling No Chest Pain No shortness of breath + constipation + heart burn + indigestion + vomiting   Objective: There were no vitals taken for this visit.  Physical Exam:  Alert and oriented.  No acute distress.  Nonambulatory.  She is  seated in wheelchair.  Surgical incisions are healing well.  No surrounding erythema or drainage.  Active motion intact in the TA/EHL.  Sensation intact over the dorsum of the foot, with the exception of the first webspace, where she has a small amount of tingling.  Some residual ecchymosis.  Swelling is significantly improved.  IMAGING: I personally ordered and reviewed the following images   X-rays of the right ankle were obtained in clinic today and demonstrates maintenance of alignment of both the lateral and medial fixation.  No evidence of hardware failure or loosening.  There is a small, less than 25% of the joint line, fracture of the posterior malleolus which remains minimally displaced.  Impression: Right ankle trimalleolar fracture in excellent alignment without hardware failure.   [name], MD 05/26/2020 1:03 PM

## 2020-05-26 NOTE — Patient Instructions (Signed)

## 2020-05-28 ENCOUNTER — Telehealth: Payer: Self-pay | Admitting: Orthopedic Surgery

## 2020-05-28 MED ORDER — OXYCODONE HCL 5 MG PO TABS: 5.0000 mg | ORAL_TABLET | Freq: Four times a day (QID) | ORAL | 0 refills | Status: DC | PRN

## 2020-05-28 NOTE — Telephone Encounter (Signed)
Patient request refill for pain medicine   oxyCODONE (ROXICODONE) 5 MG immediate release tablet    Pharmacy: [facility] in [place]

## 2020-06-09 ENCOUNTER — Ambulatory Visit (INDEPENDENT_AMBULATORY_CARE_PROVIDER_SITE_OTHER): Payer: 59 | Admitting: Orthopedic Surgery

## 2020-06-09 ENCOUNTER — Encounter: Payer: Self-pay | Admitting: Orthopedic Surgery

## 2020-06-09 ENCOUNTER — Ambulatory Visit: Payer: 59

## 2020-06-09 ENCOUNTER — Other Ambulatory Visit: Payer: Self-pay

## 2020-06-09 DIAGNOSIS — S82851D Displaced trimalleolar fracture of right lower leg, subsequent encounter for closed fracture with routine healing: Secondary | ICD-10-CM | POA: Diagnosis not present

## 2020-06-09 MED ORDER — CEPHALEXIN 250 MG PO CAPS: 250.0000 mg | ORAL_CAPSULE | Freq: Four times a day (QID) | ORAL | 0 refills | Status: DC

## 2020-06-09 MED ORDER — OXYCODONE HCL 5 MG PO TABS: 5.0000 mg | ORAL_TABLET | Freq: Four times a day (QID) | ORAL | 0 refills | Status: AC | PRN

## 2020-06-09 NOTE — Patient Instructions (Signed)

## 2020-06-09 NOTE — Progress Notes (Signed)
Orthopaedic Postop Note  Assessment: Kimberly Mahoney is a 60 y.o. female s/p ORIF of Right trimalleolar ankle fracture with medial and lateral fixation; posterior mal <25% of articular surface, no fixation  DOS: 05/11/2020  Plan: Small area lateral incision with small amount of maceration, mild tenderness; prescribed Keflex x 10 days Well padded short leg cast placed in clinic today NWB on the operative extremity Continue to take Aspirin 81 mg; once daily Pain medications as needed; continue to wean off narcotics.  This will be the last prescription provided  Heart burn and indigestion likely related to constipation associated with narcotic use; constipation improving.  Encouraged her not to lay down after eating.  Zofran as needed.  Wean off oxycodone.  Discuss with PCP if persists.   Colace BID while taking oxycodone; miralax as needed   Cast application - right short leg cast   Verbal consent was obtained and the correct extremity was identified. A well padded, appropriately molded short leg cast was applied to the right leg Toes remained warm and well perfused.   There were no sharp edges Patient tolerated the procedure well Cast care instructions were provided     Follow-up: No follow-ups on file. XR at next visit: Right ankle; out of cast  Subjective:  Chief Complaint  Patient presents with  . Routine Post Op    History of Present Illness: Kimberly Mahoney is a 60 y.o. female who presents following the above stated procedure.  Continues to improve.  Pain is better.  Noticed some burning and irritation over the lateral ankle.  Continues to have some indigestion.  Constipation is improving.  She is taking less of the oxycodone.   Review of Systems: No fevers or chills + tingling No Chest Pain No shortness of breath + constipation + heart burn + indigestion + vomiting   Objective: There were no vitals taken for this visit.  Physical Exam:  Alert and oriented.   No acute distress.  Nonambulatory.  She is seated in wheelchair.  Medial surgical incision is healing well.  Some scabbing, but no surrounding erythema or drainage.  Lateral incision has a small, macerated area in the mid aspect of the incision.  No active drainage.  Skin looks irritated in this area.  No erythema or drainage otherwise.  Some scabbing around the incisions.  Active motion intact in the TA and EHL.  Decreased sensation in the sural, saphenous superficial peroneal and deep peroneal nerve distribution.  No swelling around the ankle.  Toes are warm and well perfused.  2+ DP pulse.  IMAGING: I personally ordered and reviewed the following images   X-ray of the right ankle was obtained in clinic today and demonstrates interval healing of the fractures both medial and lateral.  There is been no interval displacement.  There is no hardware failure or loosening.  The posterior malleolus fragment remains stable, without further displacement.  Impression: Right trimalleolar ankle fracture with stable fixation, in good position.   [name], MD 06/09/2020 10:16 AM

## 2020-06-23 ENCOUNTER — Encounter: Payer: Self-pay | Admitting: Orthopedic Surgery

## 2020-06-23 ENCOUNTER — Ambulatory Visit (INDEPENDENT_AMBULATORY_CARE_PROVIDER_SITE_OTHER): Payer: 59 | Admitting: Orthopedic Surgery

## 2020-06-23 ENCOUNTER — Other Ambulatory Visit: Payer: Self-pay

## 2020-06-23 ENCOUNTER — Ambulatory Visit: Payer: 59

## 2020-06-23 VITALS — Ht 66.0 in | Wt 181.0 lb

## 2020-06-23 DIAGNOSIS — S82851D Displaced trimalleolar fracture of right lower leg, subsequent encounter for closed fracture with routine healing: Secondary | ICD-10-CM

## 2020-06-23 NOTE — Patient Instructions (Signed)
OK to advance weight bearing in a boot  Ok to shower, no bath or submerging the ankle.  Do not sit in a tub, hot tub, pool for another 6 weeks  Referral to PT

## 2020-06-23 NOTE — Progress Notes (Signed)
Orthopaedic Postop Note  Assessment: Kimberly Mahoney is a 60 y.o. female s/p ORIF of Right trimalleolar ankle fracture with medial and lateral fixation; posterior mal <25% of articular surface, no fixation  DOS: 05/11/2020  Plan: Incisions are healing well.  Some residual scabbing, no drainage, no tenderness to palpation. XR demonstrates continued healing.  We will advance her to weightbearing as tolerated in a walking boot.  We will provide a referral for physical therapy to work on strengthening and gait training.  Okay to get in the shower, but do not scrub the incisions, and pat them dry.  No submerging the incisions.  She will contact the clinic if she has any issues prior to seeing her in approximately 6 weeks.   Follow-up: Return in about 6 weeks (around 08/04/2020). XR at next visit: Right ankle  Subjective:  Chief Complaint  Patient presents with  . Routine Post Op    ORIF Rt ankle DOS 05/11/20    History of Present Illness: Kimberly Mahoney is a 60 y.o. female who presents following the above stated procedure.  She is approximately 6 weeks out from surgery, doing well.  She was prescribed Keflex at the last visit, she tolerated this.  She is not having any pain in her ankle.  She is no longer taking pain medications.  She was previously having issues with indigestion, but this is significantly improved as well.  She states the ankle feels stiff.  Review of Systems: No fevers or chills + tingling No Chest Pain No shortness of breath No constipation No indigestion. No heartburn.   Objective: Ht 5\' 6"  (1.676 m)   Wt 181 lb (82.1 kg)   BMI 29.21 kg/m   Physical Exam:  Alert and oriented.  No acute distress.  She is seated in wheelchair.  Surgical incisions are healing well.  There is some residual scabbing of both the medial and lateral incisions.  No tenderness to palpation along the lateral surgical incision.  There is no drainage.  Previous area of maceration is dry.   She easily gets to a plantigrade position, with plantarflexion of least 30 degrees.  No pain with ankle range of motion.  Mildly decreased sensation over the dorsum of the foot.  Active motion is intact to the TA and EHL.  Toes are warm and well perfused.  2+ DP pulse.  IMAGING: I personally ordered and reviewed the following images   X-rays of the right ankle were obtained in clinic today and demonstrates interval consolidation at the fracture sites.  There has been no hardware failure or subsidence.  There has been no interval displacement of the fracture sites.  Impression: Healing right trimalleolar ankle fracture, without hardware failure.   , MD 06/23/2020 9:14 AM

## 2020-07-28 ENCOUNTER — Encounter: Payer: 59 | Admitting: Orthopedic Surgery

## 2020-08-03 ENCOUNTER — Other Ambulatory Visit: Payer: Self-pay | Admitting: Gastroenterology

## 2020-08-06 ENCOUNTER — Ambulatory Visit (HOSPITAL_COMMUNITY): Payer: 59 | Admitting: Certified Registered Nurse Anesthetist

## 2020-08-06 ENCOUNTER — Encounter (HOSPITAL_COMMUNITY): Payer: Self-pay | Admitting: Gastroenterology

## 2020-08-06 ENCOUNTER — Other Ambulatory Visit: Payer: Self-pay

## 2020-08-06 ENCOUNTER — Encounter (HOSPITAL_COMMUNITY)
Admission: RE | Disposition: A | Discharge status: Home / Self Care | Payer: Self-pay | Source: Home / Self Care | Attending: Gastroenterology

## 2020-08-06 ENCOUNTER — Ambulatory Visit (HOSPITAL_COMMUNITY)
Admission: RE | Admit: 2020-08-06 | Discharge: 2020-08-06 | Disposition: A | Discharge status: Home / Self Care | Payer: 59 | Attending: Gastroenterology | Admitting: Gastroenterology

## 2020-08-06 DIAGNOSIS — R634 Abnormal weight loss: Secondary | ICD-10-CM | POA: Diagnosis not present

## 2020-08-06 DIAGNOSIS — I252 Old myocardial infarction: Secondary | ICD-10-CM | POA: Diagnosis not present

## 2020-08-06 DIAGNOSIS — Z9049 Acquired absence of other specified parts of digestive tract: Secondary | ICD-10-CM | POA: Diagnosis not present

## 2020-08-06 DIAGNOSIS — J449 Chronic obstructive pulmonary disease, unspecified: Secondary | ICD-10-CM | POA: Insufficient documentation

## 2020-08-06 DIAGNOSIS — I130 Hypertensive heart and chronic kidney disease with heart failure and stage 1 through stage 4 chronic kidney disease, or unspecified chronic kidney disease: Secondary | ICD-10-CM | POA: Insufficient documentation

## 2020-08-06 DIAGNOSIS — K2100 Gastro-esophageal reflux disease with esophagitis, without bleeding: Secondary | ICD-10-CM | POA: Insufficient documentation

## 2020-08-06 DIAGNOSIS — N189 Chronic kidney disease, unspecified: Secondary | ICD-10-CM | POA: Diagnosis not present

## 2020-08-06 DIAGNOSIS — Z87891 Personal history of nicotine dependence: Secondary | ICD-10-CM | POA: Insufficient documentation

## 2020-08-06 DIAGNOSIS — K222 Esophageal obstruction: Secondary | ICD-10-CM | POA: Insufficient documentation

## 2020-08-06 DIAGNOSIS — Z79899 Other long term (current) drug therapy: Secondary | ICD-10-CM | POA: Diagnosis not present

## 2020-08-06 DIAGNOSIS — I509 Heart failure, unspecified: Secondary | ICD-10-CM | POA: Insufficient documentation

## 2020-08-06 DIAGNOSIS — K449 Diaphragmatic hernia without obstruction or gangrene: Secondary | ICD-10-CM | POA: Diagnosis not present

## 2020-08-06 DIAGNOSIS — Z888 Allergy status to other drugs, medicaments and biological substances status: Secondary | ICD-10-CM | POA: Insufficient documentation

## 2020-08-06 DIAGNOSIS — R1013 Epigastric pain: Secondary | ICD-10-CM | POA: Diagnosis present

## 2020-08-06 HISTORY — PX: ESOPHAGOGASTRODUODENOSCOPY (EGD) WITH PROPOFOL: SHX5813

## 2020-08-06 SURGERY — ESOPHAGOGASTRODUODENOSCOPY (EGD) WITH PROPOFOL
Anesthesia: Monitor Anesthesia Care

## 2020-08-06 MED ORDER — SODIUM CHLORIDE 0.9 % IV SOLN: INTRAVENOUS | Status: DC

## 2020-08-06 MED ORDER — PROPOFOL 500 MG/50ML IV EMUL
INTRAVENOUS | Status: AC
  Filled 2020-08-06: qty 50

## 2020-08-06 MED ORDER — LIDOCAINE 2% (20 MG/ML) 5 ML SYRINGE
INTRAMUSCULAR | Status: DC | PRN
  Administered 2020-08-06: 60 mg via INTRAVENOUS

## 2020-08-06 MED ORDER — ONDANSETRON HCL 4 MG/2ML IJ SOLN
INTRAMUSCULAR | Status: DC | PRN
  Administered 2020-08-06: 4 mg via INTRAVENOUS

## 2020-08-06 MED ORDER — LACTATED RINGERS IV SOLN: INTRAVENOUS | Status: DC | PRN

## 2020-08-06 MED ORDER — PROPOFOL 10 MG/ML IV BOLUS
INTRAVENOUS | Status: DC | PRN
  Administered 2020-08-06: 20 mg via INTRAVENOUS

## 2020-08-06 MED ORDER — PROPOFOL 500 MG/50ML IV EMUL
INTRAVENOUS | Status: DC | PRN
  Administered 2020-08-06: 125 ug/kg/min via INTRAVENOUS

## 2020-08-06 SURGICAL SUPPLY — 14 items

## 2020-08-06 NOTE — Anesthesia Preprocedure Evaluation (Signed)
Anesthesia Evaluation  Patient identified by MRN, date of birth, ID band Patient awake    Reviewed: Allergy & Precautions, NPO status , Patient's Chart, lab work & pertinent test results  Airway Mallampati: II  TM Distance: >3 FB Neck ROM: Full    Dental   Pulmonary COPD, Current Smoker and Patient abstained from smoking.,    breath sounds clear to auscultation       Cardiovascular hypertension, Pt. on medications + Past MI and +CHF   Rhythm:Regular Rate:Normal     Neuro/Psych negative neurological ROS     GI/Hepatic negative GI ROS, Neg liver ROS,   Endo/Other  Hypothyroidism   Renal/GU Renal disease     Musculoskeletal  (+) Arthritis ,   Abdominal   Peds  Hematology negative hematology ROS (+)   Anesthesia Other Findings   Reproductive/Obstetrics                             Anesthesia Physical Anesthesia Plan  ASA: 3  Anesthesia Plan: MAC   Post-op Pain Management:    Induction:   PONV Risk Score and Plan: Propofol infusion and Treatment may vary due to age or medical condition  Airway Management Planned: Natural Airway and Nasal Cannula  Additional Equipment:   Intra-op Plan:   Post-operative Plan:   Informed Consent: I have reviewed the patients History and Physical, chart, labs and discussed the procedure including the risks, benefits and alternatives for the proposed anesthesia with the patient or authorized representative who has indicated his/her understanding and acceptance.       Plan Discussed with:   Anesthesia Plan Comments:         Anesthesia Quick Evaluation

## 2020-08-06 NOTE — Op Note (Signed)
[facility] Patient Name: Kimberly Mahoney Procedure Date: 08/06/2020 MRN: [id number] Attending MD: Jeani Hawking , MD Date of Birth: [birth year] CSN: [id number] Age: 60 Admit Type: Outpatient Procedure:                Upper GI endoscopy Indications:              Epigastric abdominal pain Providers:                Jeani Hawking, MD, Nehemiah Settle Person, Lawson Radar,                            Technician, Beryle Beams, Technician, Maricela Curet, CRNA Referring MD:              Medicines:                Propofol per Anesthesia Complications:            No immediate complications. Estimated Blood Loss:     Estimated blood loss: none. Procedure:                Pre-Anesthesia Assessment:                           - Prior to the procedure, a History and Physical                            was performed, and patient medications and                            allergies were reviewed. The patient's tolerance of                            previous anesthesia was also reviewed. The risks                            and benefits of the procedure and the sedation                            options and risks were discussed with the patient.                            All questions were answered, and informed consent                            was obtained. Prior Anticoagulants: The patient has                            taken no previous anticoagulant or antiplatelet                            agents. ASA Grade Assessment: III - A patient with                            severe systemic  disease. After reviewing the risks                            and benefits, the patient was deemed in                            satisfactory condition to undergo the procedure.                           - Sedation was administered by an anesthesia                            professional. Deep sedation was attained.                           After obtaining informed consent, the  endoscope was                            passed under direct vision. Throughout the                            procedure, the patient's blood pressure, pulse, and                            oxygen saturations were monitored continuously. The                            GIF-H190 ([id number] Olympus gastroscope was                            introduced through the mouth, and advanced to the                            second part of duodenum. The upper GI endoscopy was                            accomplished without difficulty. The patient                            tolerated the procedure well. Scope In: Scope Out: Findings:      LA Grade D (one or more mucosal breaks involving at least 75% of       esophageal circumference) esophagitis with no bleeding was found in the       entire esophagus.      A 3 cm hiatal hernia was present.      One benign-appearing, intrinsic mild stenosis was found at the       gastroesophageal junction. This stenosis measured 1.5 cm (inner       diameter) x less than one cm (in length). The stenosis was traversed.      The stomach was normal.      The examined duodenum was normal.      From 25 cm down to the GE junction there was evidence of a pan       esophagitis. A minor stricture was noted in the at the GE junction in  the setting of a 3 cm hiatal hernia. Impression:               - LA Grade D reflux esophagitis with no bleeding.                           - 3 cm hiatal hernia.                           - Benign-appearing esophageal stenosis.                           - Normal stomach.                           - Normal examined duodenum.                           - No specimens collected. Moderate Sedation:      Not Applicable - Patient had care per Anesthesia. Recommendation:           - Patient has a contact number available for                            emergencies. The signs and symptoms of potential                            delayed  complications were discussed with the                            patient. Return to normal activities tomorrow.                            Written discharge instructions were provided to the                            patient.                           - Resume regular diet.                           - Continue present medications.                           - Increase pantoprazole (Protonix) to twice per                            day, i.e., 30 minutes before breakfast and 30                            minutes before dinner.                           - Add sucralfate 1 gram TID.                           - Follow up in the  office in 1 month.                           - Repeat EGD in two months to assess for Barrett's                            esophagus provided that she has a resolution of her                            symptoms. Procedure Code(s):        --- Professional ---                           [phone], Esophagogastroduodenoscopy, flexible,                            transoral; diagnostic, including collection of                            specimen(s) by brushing or washing, when performed                            (separate procedure) Diagnosis Code(s):        --- Professional ---                           K21.00, Gastro-esophageal reflux disease with                            esophagitis, without bleeding                           K44.9, Diaphragmatic hernia without obstruction or                            gangrene                           K22.2, Esophageal obstruction                           R10.13, Epigastric pain CPT copyright 2019 [employer]. All rights reserved. The codes documented in this report are preliminary and upon coder review may  be revised to meet current compliance requirements. Jeani Hawking, MD Jeani Hawking, MD 08/06/2020 1:53:00 PM This report has been signed electronically. Number of Addenda: 0

## 2020-08-06 NOTE — H&P (Signed)
Kimberly Mahoney HPI:  Pt is a 60 y.o. woman with a PMH significant for GERD, COPD, anxiety, and SLE who presents today for evaluation of worsening reflux symptoms and weight loss. The pt reports that she has always had baseline, intermittent gastroesophageal reflux that has been generally well-controlled with nightly use of omeprazole; however, in the past two months she has experienced a worsening and expansion of symptoms to include persistent reflux, a feeling of fullness in the stomach, nausea and vomiting, burning discomfort with swallowing solids and liquids, mouth sores, and weight loss. The pt also reports occasional coughing, but states she has COPD and this is not unusual for her. She describes that whenever she eats, food seems to sit in her stomach which causes her to feel uncomfortably full and eventually vomit. She denies vomiting immediately after swallowing, but notes that sometimes the food that she vomits appears undigested. There has been no blood in the vomit. She has lost 24 pounds over the past two months and believes this is related to being unable to keep food down. About two weeks ago she was given a prescription of Protonix by her PCP, which she has been taking nightly. She states the Protonix seems to have helped somewhat with the reflux, but it has not helped the pain with swallowing or vomiting that accompanies the feeling that food is sitting in her stomach after eating. The pt is a former smoker and quit smoking one year ago. She denies recent illness or initiation of new medications other than a prescription ooxycodone she was given in April after sustaining an ankle fracture.   Past Medical History:  Diagnosis Date   Arthritis    CHF (congestive heart failure) (HCC)    Chronic kidney disease    COPD (chronic obstructive pulmonary disease) (HCC)    Heart attack (HCC) 2021   Hypertension    Hypothyroidism    Interstitial cystitis    Lupus (HCC)     Past Surgical History:   Procedure Laterality Date   ABDOMINAL HYSTERECTOMY     CESAREAN SECTION     CHOLECYSTECTOMY     LEG SURGERY Left    orif ankle   ORIF ANKLE FRACTURE Right 05/11/2020   Procedure: RIGHT OPEN REDUCTION INTERNAL FIXATION (ORIF) ANKLE FRACTURE;  Surgeon: [name], MD;  Location: [facility];  Service: Orthopedics;  Laterality: Right;  Right trimalleolar ankle fracture     History reviewed. No pertinent family history.  Social History:  reports that she quit smoking about 13 months ago. Her smoking use included cigarettes. She has a 7.50 pack-year smoking history. She has never used smokeless tobacco. She reports current alcohol use. She reports that she does not use drugs.  Allergies:  Allergies  Allergen Reactions   Lisinopril Swelling    Medications: Scheduled: Continuous:  sodium chloride      No results found for this or any previous visit (from the past 24 hour(s)).   No results found.  ROS:  As stated above in the HPI otherwise negative.  There were no vitals taken for this visit.    PE: Gen: NAD, Alert and Oriented HEENT:  Platter/AT, EOMI Neck: Supple, no LAD Lungs: CTA Bilaterally CV: RRR without M/G/R ABD: Soft, NTND, +BS Ext: No C/C/E  Assessment/Plan: 1) Dysphagia. 2) Weight loss. 3) Abdominal pain.  Plan: 1) EGD with dilation.  Torell Minder D 08/06/2020, 1:11 PM

## 2020-08-06 NOTE — Transfer of Care (Signed)
Immediate Anesthesia Transfer of Care Note  Patient: Kimberly Mahoney  Procedure(s) Performed: ESOPHAGOGASTRODUODENOSCOPY (EGD) WITH PROPOFOL  Patient Location: PACU and Endoscopy Unit  Anesthesia Type:MAC  Level of Consciousness: awake, alert  and oriented  Airway & Oxygen Therapy: Patient Spontanous Breathing and Patient connected to face mask oxygen  Post-op Assessment: Report given to RN and Post -op Vital signs reviewed and stable  Post vital signs: Reviewed and stable  Last Vitals:  Vitals Value Taken Time  BP    Temp    Pulse 78 08/06/20 1349  Resp 16 08/06/20 1349  SpO2 100 % 08/06/20 1349  Vitals shown include unvalidated device data.  Last Pain:  Vitals:   08/06/20 1310  TempSrc: Oral  PainSc: 0-No pain         Complications: No notable events documented.

## 2020-08-06 NOTE — Discharge Instructions (Signed)

## 2020-08-08 NOTE — Anesthesia Postprocedure Evaluation (Signed)
Anesthesia Post Note  Patient: Kimberly Mahoney  Procedure(s) Performed: ESOPHAGOGASTRODUODENOSCOPY (EGD) WITH PROPOFOL     Patient location during evaluation: PACU Anesthesia Type: MAC Level of consciousness: awake and alert Pain management: pain level controlled Vital Signs Assessment: post-procedure vital signs reviewed and stable Respiratory status: spontaneous breathing, nonlabored ventilation, respiratory function stable and patient connected to nasal cannula oxygen Cardiovascular status: stable and blood pressure returned to baseline Postop Assessment: no apparent nausea or vomiting Anesthetic complications: no   No notable events documented.  Last Vitals:  Vitals:   08/06/20 1400 08/06/20 1410  BP: (!) 159/83 (!) 152/70  Pulse: 72 79  Resp: 19 18  Temp:    SpO2: 100% 100%    Last Pain:  Vitals:   08/06/20 1410  TempSrc:   PainSc: 0-No pain                 Tiajuana Amass

## 2020-08-11 ENCOUNTER — Ambulatory Visit (INDEPENDENT_AMBULATORY_CARE_PROVIDER_SITE_OTHER): Payer: 59 | Admitting: Orthopedic Surgery

## 2020-08-11 ENCOUNTER — Other Ambulatory Visit: Payer: Self-pay

## 2020-08-11 ENCOUNTER — Encounter: Payer: Self-pay | Admitting: Orthopedic Surgery

## 2020-08-11 ENCOUNTER — Ambulatory Visit: Payer: 59

## 2020-08-11 VITALS — Ht 64.0 in | Wt 163.0 lb

## 2020-08-11 DIAGNOSIS — S82851D Displaced trimalleolar fracture of right lower leg, subsequent encounter for closed fracture with routine healing: Secondary | ICD-10-CM

## 2020-08-11 NOTE — Progress Notes (Signed)
Orthopaedic Postop Note  Assessment: Kimberly Mahoney is a 60 y.o. female s/p ORIF of Right trimalleolar ankle fracture with medial and lateral fixation; posterior mal <25% of articular surface, no fixation  DOS: 05/11/2020  Plan: Patient continues to improve.  Her surgery was 3 months ago.  She is now walking in a normal shoe.  No issues with her incisions.  She is pleased with the progress to date.  Encouraged her to continue working on strength and range of motion.  Have also advised her to be extra careful on unstable ground.  We will plan to see her back in 3 months for repeat evaluation.  Follow-up: Return in about 3 months (around 11/11/2020). XR at next visit: Right ankle  Subjective:  Chief Complaint  Patient presents with   Fracture    Rt ankle ORIF  DOS: 05/11/20    History of Present Illness: Kimberly Mahoney is a 60 y.o. female who presents following the above stated procedure.  She is now 3 months out from surgery and continues to improve.  She is walking in a regular shoe.  Currently, she presents to clinic in flip-flops, without issues.  She states that she [place] also [place]  No issues with the surgical incisions.  She is not taking any medications for pain.  She went to 1 session of physical therapy, and was given a list of exercises, as well as an exercise band, and has continue to work on these exercises.  She is pleased with her improvements overall.   Review of Systems: No fevers or chills No numbness or tingling No Chest Pain No shortness of breath No constipation No indigestion. No heartburn.   Objective: Ht 5\' 4"  (1.626 m)   Wt 163 lb (73.9 kg)   BMI 27.98 kg/m   Physical Exam:  Alert and oriented.  No acute distress.  She walks with a mildly antalgic gait on the right side.  Evaluation of the right foot and ankle demonstrates no obvious swelling.  There is no tenderness to palpation.  Medial and lateral surgical incisions are  healing well, without surrounding erythema or drainage.  She does have some dry skin over the dorsum of her ankle.  She tolerates passive dorsiflexion with her knee extended to 5 degrees.  15 degrees of dorsiflexion with the knee in a bent position.  No tenderness to palpation along the lateral or medial malleolus.  No pain with inversion or eversion of her ankle.  5/5 strength in dorsiflexion of her ankle and great toe. IMAGING: I personally ordered and reviewed the following images   X-ray of the right ankle were obtained in clinic today and compared to previous x-rays.  There has been no interval displacement of the fracture sites.  The ankle remains in good alignment.  Mortise remains intact.  There is been no syndesmotic disruption.  There has been no interval displacement of the posterior malleolus fracture fragment.  Impression: Healing right trimalleolar ankle fracture, without hardware failure.  , MD 08/11/2020 11:08 AM

## 2020-09-01 ENCOUNTER — Ambulatory Visit: Payer: BC Managed Care – PPO

## 2020-09-01 ENCOUNTER — Other Ambulatory Visit: Payer: Self-pay

## 2020-09-01 ENCOUNTER — Encounter: Payer: Self-pay | Admitting: Orthopedic Surgery

## 2020-09-01 ENCOUNTER — Ambulatory Visit (INDEPENDENT_AMBULATORY_CARE_PROVIDER_SITE_OTHER): Payer: BC Managed Care – PPO | Admitting: Orthopedic Surgery

## 2020-09-01 VITALS — BP 127/74 | HR 91 | Ht 64.0 in | Wt 156.0 lb

## 2020-09-01 DIAGNOSIS — M25571 Pain in right ankle and joints of right foot: Secondary | ICD-10-CM | POA: Diagnosis not present

## 2020-09-01 MED ORDER — CEPHALEXIN 250 MG PO CAPS: 250.0000 mg | ORAL_CAPSULE | Freq: Four times a day (QID) | ORAL | 0 refills | Status: AC

## 2020-09-01 NOTE — Progress Notes (Signed)
Orthopaedic Postop Note  Assessment: Kimberly Mahoney is a 60 y.o. female s/p ORIF of Right trimalleolar ankle fracture with medial and lateral fixation; posterior mal <25% of articular surface, no fixation  DOS: 05/11/2020  Plan: Atraumatic onset of pain over the medial aspect of the right ankle.  Small amount of redness in this area.  No fluctuance.  No drainage.  Repeat radiographs demonstrates excellent alignment.  No interval displacement of the fractures.  No evidence of hardware failure.  No obvious indication of what is causing her current discomfort.  Due to a small amount of redness, and swelling in the area, I provided her with a prescription for Keflex just in case there is an indolent infection.  Otherwise, continue with activities as tolerated.  Gradually increase the level of activities.  If she has any issues, she should return to clinic.  Otherwise, I will see her at her previously scheduled appointment in just under 3 months.  Follow-up: Return for Previously scheduled appointment ~3 months. XR at next visit: Right ankle  Subjective:  Chief Complaint  Patient presents with   Ankle Pain    Rt ankle and top of the foot pain for 1-2 wks. Pt states area is warm to the touch and she has been using muscle rubs that isn't touching the pain. Pt had surgery DOS 05/11/20    History of Present Illness: Kimberly Mahoney is a 60 y.o. female who presents following the above stated procedure.  She was last seen in clinic for her right ankle 2-3 weeks ago, at which time she was doing very well.  She had already transition to a regular shoe.  Since then, she has noted some pain over the medial, and anterior aspect of the ankle.  Atraumatic onset.  No fevers or chills.  She has noticed some swelling and warmth over her medial ankle.  Her pain is worsened, which is affecting her gait.  Review of Systems: No fevers or chills No numbness or tingling No Chest Pain No shortness of breath No  constipation No indigestion. No heartburn.   Objective: BP 127/74   Pulse 91   Ht 5\' 4"  (1.626 m)   Wt 156 lb (70.8 kg)   BMI 26.78 kg/m   Physical Exam:  Alert and oriented.  No acute distress.  Right-sided antalgic gait.  Surgical incisions are healing well.  Medial surgical incision with some minor erythema.  Tenderness to palpation over the proximal aspect of the incision.  Negative Tinel's in this area.  No fluctuance is appreciated.  Areas of tenderness are proximal to the medial malleolus fracture.  Active motion of EHL/TA intact.  Sensation intact over the dorsum of her foot.  2+ DP pulse.  Toes warm and well-perfused.   IMAGING: I personally ordered and reviewed the following images   X-rays of the right ankle were obtained in clinic today, and compared to previous x-rays.  There is been no interval displacement of the trimalleolar ankle fractures.  Hardware remains in good position.  No evidence of failure or loosening.  No lucencies around the hardware.  Interval consolidation of the medial malleolus fracture fragment.  Impression: Healing right trimalleolar ankle fracture, without evidence of hardware failure or loosening.  , MD 09/01/2020 10:53 PM

## 2020-10-03 ENCOUNTER — Emergency Department (HOSPITAL_COMMUNITY)
Admission: EM | Admit: 2020-10-03 | Discharge: 2020-10-03 | Disposition: A | Discharge status: Home / Self Care | Payer: BC Managed Care – PPO | Attending: Emergency Medicine | Admitting: Emergency Medicine

## 2020-10-03 ENCOUNTER — Encounter (HOSPITAL_COMMUNITY): Payer: Self-pay | Admitting: *Deleted

## 2020-10-03 ENCOUNTER — Emergency Department (HOSPITAL_COMMUNITY): Payer: BC Managed Care – PPO

## 2020-10-03 DIAGNOSIS — N189 Chronic kidney disease, unspecified: Secondary | ICD-10-CM | POA: Diagnosis not present

## 2020-10-03 DIAGNOSIS — J449 Chronic obstructive pulmonary disease, unspecified: Secondary | ICD-10-CM | POA: Insufficient documentation

## 2020-10-03 DIAGNOSIS — Z79899 Other long term (current) drug therapy: Secondary | ICD-10-CM | POA: Diagnosis not present

## 2020-10-03 DIAGNOSIS — S99922A Unspecified injury of left foot, initial encounter: Secondary | ICD-10-CM | POA: Diagnosis present

## 2020-10-03 DIAGNOSIS — Z87891 Personal history of nicotine dependence: Secondary | ICD-10-CM | POA: Insufficient documentation

## 2020-10-03 DIAGNOSIS — S92405A Nondisplaced unspecified fracture of left great toe, initial encounter for closed fracture: Secondary | ICD-10-CM | POA: Diagnosis not present

## 2020-10-03 DIAGNOSIS — E039 Hypothyroidism, unspecified: Secondary | ICD-10-CM | POA: Diagnosis not present

## 2020-10-03 DIAGNOSIS — W19XXXA Unspecified fall, initial encounter: Secondary | ICD-10-CM | POA: Diagnosis not present

## 2020-10-03 DIAGNOSIS — Y9301 Activity, walking, marching and hiking: Secondary | ICD-10-CM | POA: Insufficient documentation

## 2020-10-03 DIAGNOSIS — I110 Hypertensive heart disease with heart failure: Secondary | ICD-10-CM | POA: Diagnosis not present

## 2020-10-03 DIAGNOSIS — I509 Heart failure, unspecified: Secondary | ICD-10-CM | POA: Insufficient documentation

## 2020-10-03 DIAGNOSIS — Y92009 Unspecified place in unspecified non-institutional (private) residence as the place of occurrence of the external cause: Secondary | ICD-10-CM | POA: Diagnosis not present

## 2020-10-03 DIAGNOSIS — S92406A Nondisplaced unspecified fracture of unspecified great toe, initial encounter for closed fracture: Secondary | ICD-10-CM

## 2020-10-03 MED ORDER — OXYCODONE-ACETAMINOPHEN 5-325 MG PO TABS
1.0000 | ORAL_TABLET | Freq: Once | ORAL | Status: AC
  Administered 2020-10-03: 1 via ORAL
  Filled 2020-10-03: qty 1

## 2020-10-03 MED ORDER — OXYCODONE-ACETAMINOPHEN 5-325 MG PO TABS: 1.0000 | ORAL_TABLET | Freq: Three times a day (TID) | ORAL | 0 refills | Status: AC | PRN

## 2020-10-03 NOTE — Discharge Instructions (Addendum)
You have fractured your left great toe I placed you in a boot please wear during the day you may take off at nighttime.  While not use I recommend elevating the foot applying ice to the areas as well decrease inflammation and swelling.  Recommend over-the-counter pain medications.  Please follow-up with your orthopedic surgeon for further evaluation.  Come back to the emergency department if you develop chest pain, shortness of breath, severe abdominal pain, uncontrolled nausea, vomiting, diarrhea.

## 2020-10-03 NOTE — ED Triage Notes (Signed)
Fell at home this am, pain in left foot

## 2020-10-03 NOTE — ED Provider Notes (Signed)
[facility] EMERGENCY DEPARTMENT Provider Note   CSN: [id number] Arrival date & time: 10/03/20  1233     History Chief Complaint  Patient presents with   Fall   Foot Pain    Kimberly Mahoney is a 60 y.o. female.  HPI  Patient with significant medical history of arthritis, CHF, COPD, hypertension presents to the emergency department with chief complaint of a fall.  Patient states today she was walking and her right leg gave out causing fall onto her left side.  Patient states that she recently had surgery on her right knee and has been weak ever since she is trying to build up strength after the surgery.  She states that she twisted her left foot and ankle landed onto her side.  She denies hitting her head, losing conscious, is not on anticoagulant.  She does not endorse headaches, change in vision, paresthesia or weakness upper/ lower extremities, she denies neck pain, chest pain, abdominal pain, hip pain.  She states that she is having left great toe pain as well as left knee pain she denies paresthesias in either area, is able to move her toes ankle and knee.  She has no other complaints.   Past Medical History:  Diagnosis Date   Arthritis    CHF (congestive heart failure) (HCC)    Chronic kidney disease    COPD (chronic obstructive pulmonary disease) (HCC)    Heart attack (HCC) 2021   Hypertension    Hypothyroidism    Interstitial cystitis    Lupus (HCC)     There are no problems to display for this patient.   Past Surgical History:  Procedure Laterality Date   ABDOMINAL HYSTERECTOMY     CESAREAN SECTION     CHOLECYSTECTOMY     ESOPHAGOGASTRODUODENOSCOPY (EGD) WITH PROPOFOL N/A 08/06/2020   Procedure: ESOPHAGOGASTRODUODENOSCOPY (EGD) WITH PROPOFOL;  Surgeon: Jeani Hawking, MD;  Location: [facility];  Service: Endoscopy;  Laterality: N/A;   LEG SURGERY Left    orif ankle   ORIF ANKLE FRACTURE Right 05/11/2020   Procedure: RIGHT OPEN REDUCTION INTERNAL FIXATION (ORIF)  ANKLE FRACTURE;  Surgeon: [name], MD;  Location: [facility];  Service: Orthopedics;  Laterality: Right;  Right trimalleolar ankle fracture      OB History   No obstetric history on file.     No family history on file.  Social History   Tobacco Use   Smoking status: Former    Packs/day: 0.25    Years: 30.00    Pack years: 7.50    Types: Cigarettes    Quit date: 06/2019    Years since quitting: 1.2   Smokeless tobacco: Never  Vaping Use   Vaping Use: Never used  Substance Use Topics   Alcohol use: Yes    Comment: socially   Drug use: No    Home Medications Prior to Admission medications   Medication Sig Start Date End Date Taking? Authorizing Provider  amitriptyline (ELAVIL) 50 MG tablet Take 100 mg by mouth at bedtime. 08/15/14   [provider]  atorvastatin (LIPITOR) 40 MG tablet Take 40 mg by mouth daily.    [provider]  cephALEXin (KEFLEX) 250 MG capsule Take 1 capsule (250 mg total) by mouth 4 (four) times daily. 09/01/20   [name], MD  cetirizine (ZYRTEC) 10 MG tablet Take 10 mg by mouth daily. 04/18/20   [provider]  furosemide (LASIX) 40 MG tablet Take 40 mg by mouth daily.  [provider]  hydrALAZINE (APRESOLINE) 50 MG tablet Take 50 mg by mouth 3 (three) times daily. 04/07/20   [provider]  HYDROcodone-acetaminophen (NORCO) 7.5-325 MG tablet Take 1 tablet by mouth 4 (four) times daily as needed. 07/09/20   [provider]  hydroxychloroquine (PLAQUENIL) 200 MG tablet Take 200 mg by mouth 2 (two) times daily.    [provider]  isosorbide dinitrate (ISORDIL) 20 MG tablet Take 20 mg by mouth 3 (three) times daily. 04/07/20   [provider]  isosorbide-hydrALAZINE (BIDIL) 20-37.5 MG tablet Take 1 tablet by mouth 3 (three) times daily.    [provider]  levothyroxine (SYNTHROID) 112 MCG tablet Take 112 mcg by mouth daily before breakfast.    [provider]   metoprolol succinate (TOPROL-XL) 100 MG 24 hr tablet Take 25 mg by mouth daily. 06/21/16   [provider]  nitroGLYCERIN (NITROSTAT) 0.4 MG SL tablet Place 0.4 mg under the tongue every 5 (five) minutes as needed for chest pain.    [provider]  oxyCODONE (ROXICODONE) 5 MG immediate release tablet Take 1 tablet (5 mg total) by mouth every 6 (six) hours as needed for severe pain. Patient not taking: Reported on 09/01/2020 06/09/20   [name], MD  pantoprazole (PROTONIX) 40 MG tablet Take 40 mg by mouth daily. 07/13/20   [provider]  potassium chloride (KLOR-CON) 10 MEQ tablet Take 10 mEq by mouth daily.    [provider]  umeclidinium-vilanterol (ANORO ELLIPTA) 62.5-25 MCG/INH AEPB Inhale 1 puff into the lungs daily.    [provider]    Allergies    Lisinopril  Review of Systems   Review of Systems  Constitutional:  Negative for chills and fever.  HENT:  Negative for congestion.   Respiratory:  Negative for shortness of breath.   Cardiovascular:  Negative for chest pain.  Gastrointestinal:  Negative for abdominal pain.  Genitourinary:  Negative for enuresis.  Musculoskeletal:  Negative for back pain.       Left toe and knee pain  Skin:  Negative for rash.  Neurological:  Negative for dizziness.  Hematological:  Does not bruise/bleed easily.   Physical Exam Updated Vital Signs BP 106/69   Pulse 87   Temp (!) 97.3 F (36.3 C)   Resp 20   Wt 62 kg   SpO2 98%   BMI 23.46 kg/m   Physical Exam Vitals and nursing note reviewed.  Constitutional:      General: She is not in acute distress.    Appearance: Normal appearance. She is not ill-appearing or diaphoretic.  HENT:     Head: Normocephalic and atraumatic.     Nose: No congestion or rhinorrhea.  Eyes:     General:        Right eye: No discharge.        Left eye: No discharge.     Conjunctiva/sclera: Conjunctivae normal.  Cardiovascular:     Rate and Rhythm: Normal  rate and regular rhythm.  Pulmonary:     Effort: Pulmonary effort is normal.  Musculoskeletal:     Cervical back: Neck supple.     Right lower leg: No edema.     Left lower leg: No edema.     Comments: Patient's left foot was visualized she has slight edema over the dorsum of the left foot and some ecchymosis around the left great toe.  No other gross deformities present, she has full range of motion of all  toes ankle and knee, she is tender to palpation along her great toe and proximally on the second through fourth metatarsals no gross deformities present.  She also has tenderness along her tibial plateau worse on the left versus the right no deformities present, no joint laxity noted.  Spine was nontender to palpation, no deformities or hip instability noted on my exam  Skin:    General: Skin is warm and dry.     Coloration: Skin is not jaundiced or pale.  Neurological:     Mental Status: She is alert and oriented to person, place, and time.  Psychiatric:        Mood and Affect: Mood normal.    ED Results / Procedures / Treatments   Labs (all labs ordered are listed, but only abnormal results are displayed) Labs Reviewed - No data to display  EKG None  Radiology DG Knee Complete 4 Views Left  Result Date: 10/03/2020 CLINICAL DATA:  Fall with LEFT knee pain. EXAM: LEFT KNEE - COMPLETE 4+ VIEW COMPARISON:  None. FINDINGS: No definite acute fracture, subluxation or dislocation. No joint effusion is noted. Tricompartmental degenerative changes are present manifested by joint space narrowing and osteophytosis. No focal bony lesions are present. IMPRESSION: 1. No evidence of acute abnormality. 2. Tricompartmental degenerative changes. 3. Consider follow-up or further imaging as clinically indicated. Electronically Signed   By: Harmon Pier M.D.   On: 10/03/2020 14:39   DG Foot Complete Left  Result Date: 10/03/2020 CLINICAL DATA:  Fall this morning, medial left foot pain radiating into  the ankle EXAM: LEFT FOOT - COMPLETE 3+ VIEW COMPARISON:  04/23/2007 CT scan FINDINGS: Lateral malleolar plate and screw fixator. Small well corticated bony structure along the proximal-medial margin of the navicular favoring a accessory navicular. Oblique deformity in the proximal phalanx great toe compatible with fracture involving the shaft and distal metaphysis. Normal alignment at the Lisfranc joint. No other acute fracture identified. Plantar calcaneal spur. IMPRESSION: 1. Acute oblique fracture of the shaft and distal metaphysis of the proximal phalanx of the great toe. 2. Plantar calcaneal spur. Electronically Signed   By: Gaylyn Rong M.D.   On: 10/03/2020 13:40    Procedures Procedures   Medications Ordered in ED Medications  oxyCODONE-acetaminophen (PERCOCET/ROXICET) 5-325 MG per tablet 1 tablet (1 tablet Oral Given 10/03/20 1438)    ED Course  I have reviewed the triage vital signs and the nursing notes.  Pertinent labs & imaging results that were available during my care of the patient were reviewed by me and considered in my medical decision making (see chart for details).    MDM Rules/Calculators/A&P                          Initial impression-patient presents after a fall.  She is alert, does not appear to be in distress, vital signs reassuring.  Triage obtained imaging of the left foot will also add on left knee for reevaluation.  Work-up-DG of left foot reveals acute oblique fracture of the shaft and distal metaphysis of the proximal phalanx of the great toe.  DG of left knee negative for acute findings.  Rule out- I have low suspicion for septic arthritis as patient denies IV drug use, skin exam was performed no erythematous, edematous, warm joints noted on exam, no new heart murmur heard on exam.  Low suspicion for  dislocation as x-ray does not reveal significant findings. low suspicion for ligament or tendon damage  as area was palpated no gross defects noted, they  had full range of motion as well as 5/5 strength.  Low suspicion for compartment syndrome as area was palpated it was soft to the touch, neurovascular fully intact.   Plan-  Fall-suspect mechanical nature, resulted in a fracture of the left great toe will place her in a boot, recommend over-the-counter pain medications, elevation, ice, follow-up with orthopedic surgery for further evaluation.  Vital signs have remained stable, no indication for hospital admission.  Patient discussed with attending and they agreed with assessment and plan.  Patient given at home care as well strict return precautions.  Patient verbalized that they understood agreed to said plan.  Final Clinical Impression(s) / ED Diagnoses Final diagnoses:  Fall  Nondisplaced unspecified fracture of unspecified great toe, initial encounter for closed fracture    Rx / DC Orders ED Discharge Orders     None        Carroll Sage, PA-C 10/03/20 1510    Derwood Kaplan, MD 10/03/20 1520

## 2020-10-05 MED FILL — Oxycodone w/ Acetaminophen Tab 5-325 MG: ORAL | Qty: 6 | Status: AC

## 2020-11-06 ENCOUNTER — Encounter: Payer: Self-pay | Admitting: Orthopedic Surgery

## 2020-11-10 ENCOUNTER — Ambulatory Visit: Payer: 59 | Admitting: Orthopedic Surgery

## 2022-01-18 DIAGNOSIS — G894 Chronic pain syndrome: Secondary | ICD-10-CM | POA: Diagnosis not present

## 2022-01-18 DIAGNOSIS — N3091 Cystitis, unspecified with hematuria: Secondary | ICD-10-CM | POA: Diagnosis not present

## 2022-02-14 DIAGNOSIS — E785 Hyperlipidemia, unspecified: Secondary | ICD-10-CM | POA: Diagnosis not present

## 2022-02-14 DIAGNOSIS — I5020 Unspecified systolic (congestive) heart failure: Secondary | ICD-10-CM | POA: Diagnosis not present

## 2022-02-14 DIAGNOSIS — I10 Essential (primary) hypertension: Secondary | ICD-10-CM | POA: Diagnosis not present

## 2022-02-14 DIAGNOSIS — I2510 Atherosclerotic heart disease of native coronary artery without angina pectoris: Secondary | ICD-10-CM | POA: Diagnosis not present

## 2022-02-15 DIAGNOSIS — G894 Chronic pain syndrome: Secondary | ICD-10-CM | POA: Diagnosis not present

## 2022-02-15 DIAGNOSIS — M328 Other forms of systemic lupus erythematosus: Secondary | ICD-10-CM | POA: Diagnosis not present

## 2022-02-15 DIAGNOSIS — Z79891 Long term (current) use of opiate analgesic: Secondary | ICD-10-CM | POA: Diagnosis not present

## 2022-02-15 DIAGNOSIS — Z79899 Other long term (current) drug therapy: Secondary | ICD-10-CM | POA: Diagnosis not present

## 2022-02-15 DIAGNOSIS — N3091 Cystitis, unspecified with hematuria: Secondary | ICD-10-CM | POA: Diagnosis not present

## 2022-02-22 DIAGNOSIS — R5383 Other fatigue: Secondary | ICD-10-CM | POA: Diagnosis not present

## 2022-02-22 DIAGNOSIS — R768 Other specified abnormal immunological findings in serum: Secondary | ICD-10-CM | POA: Diagnosis not present

## 2022-02-22 DIAGNOSIS — R21 Rash and other nonspecific skin eruption: Secondary | ICD-10-CM | POA: Diagnosis not present

## 2022-02-22 DIAGNOSIS — Z6827 Body mass index (BMI) 27.0-27.9, adult: Secondary | ICD-10-CM | POA: Diagnosis not present

## 2022-02-22 DIAGNOSIS — R700 Elevated erythrocyte sedimentation rate: Secondary | ICD-10-CM | POA: Diagnosis not present

## 2022-02-22 DIAGNOSIS — E663 Overweight: Secondary | ICD-10-CM | POA: Diagnosis not present

## 2022-02-22 DIAGNOSIS — M2540 Effusion, unspecified joint: Secondary | ICD-10-CM | POA: Diagnosis not present

## 2022-02-22 DIAGNOSIS — M79641 Pain in right hand: Secondary | ICD-10-CM | POA: Diagnosis not present

## 2022-02-22 DIAGNOSIS — M79642 Pain in left hand: Secondary | ICD-10-CM | POA: Diagnosis not present

## 2022-02-22 DIAGNOSIS — M2560 Stiffness of unspecified joint, not elsewhere classified: Secondary | ICD-10-CM | POA: Diagnosis not present

## 2022-03-10 DIAGNOSIS — M329 Systemic lupus erythematosus, unspecified: Secondary | ICD-10-CM | POA: Diagnosis not present

## 2022-03-10 DIAGNOSIS — M79642 Pain in left hand: Secondary | ICD-10-CM | POA: Diagnosis not present

## 2022-03-10 DIAGNOSIS — R300 Dysuria: Secondary | ICD-10-CM | POA: Diagnosis not present

## 2022-03-10 DIAGNOSIS — M2560 Stiffness of unspecified joint, not elsewhere classified: Secondary | ICD-10-CM | POA: Diagnosis not present

## 2022-03-10 DIAGNOSIS — M2540 Effusion, unspecified joint: Secondary | ICD-10-CM | POA: Diagnosis not present

## 2022-03-10 DIAGNOSIS — M79641 Pain in right hand: Secondary | ICD-10-CM | POA: Diagnosis not present

## 2022-03-10 DIAGNOSIS — R700 Elevated erythrocyte sedimentation rate: Secondary | ICD-10-CM | POA: Diagnosis not present

## 2022-03-10 DIAGNOSIS — Z6827 Body mass index (BMI) 27.0-27.9, adult: Secondary | ICD-10-CM | POA: Diagnosis not present

## 2022-03-10 DIAGNOSIS — R768 Other specified abnormal immunological findings in serum: Secondary | ICD-10-CM | POA: Diagnosis not present

## 2022-03-15 DIAGNOSIS — Z79891 Long term (current) use of opiate analgesic: Secondary | ICD-10-CM | POA: Diagnosis not present

## 2022-03-15 DIAGNOSIS — M328 Other forms of systemic lupus erythematosus: Secondary | ICD-10-CM | POA: Diagnosis not present

## 2022-03-15 DIAGNOSIS — G894 Chronic pain syndrome: Secondary | ICD-10-CM | POA: Diagnosis not present

## 2022-03-15 DIAGNOSIS — N3091 Cystitis, unspecified with hematuria: Secondary | ICD-10-CM | POA: Diagnosis not present

## 2022-03-18 DIAGNOSIS — N390 Urinary tract infection, site not specified: Secondary | ICD-10-CM | POA: Diagnosis not present

## 2022-03-18 DIAGNOSIS — Z299 Encounter for prophylactic measures, unspecified: Secondary | ICD-10-CM | POA: Diagnosis not present

## 2022-03-18 DIAGNOSIS — I700 Atherosclerosis of aorta: Secondary | ICD-10-CM | POA: Diagnosis not present

## 2022-03-18 DIAGNOSIS — I10 Essential (primary) hypertension: Secondary | ICD-10-CM | POA: Diagnosis not present

## 2022-03-18 DIAGNOSIS — R350 Frequency of micturition: Secondary | ICD-10-CM | POA: Diagnosis not present

## 2022-04-04 DIAGNOSIS — M2540 Effusion, unspecified joint: Secondary | ICD-10-CM | POA: Diagnosis not present

## 2022-04-04 DIAGNOSIS — M79642 Pain in left hand: Secondary | ICD-10-CM | POA: Diagnosis not present

## 2022-04-04 DIAGNOSIS — R768 Other specified abnormal immunological findings in serum: Secondary | ICD-10-CM | POA: Diagnosis not present

## 2022-04-04 DIAGNOSIS — R700 Elevated erythrocyte sedimentation rate: Secondary | ICD-10-CM | POA: Diagnosis not present

## 2022-04-04 DIAGNOSIS — M79641 Pain in right hand: Secondary | ICD-10-CM | POA: Diagnosis not present

## 2022-04-04 DIAGNOSIS — M2560 Stiffness of unspecified joint, not elsewhere classified: Secondary | ICD-10-CM | POA: Diagnosis not present

## 2022-04-04 DIAGNOSIS — M329 Systemic lupus erythematosus, unspecified: Secondary | ICD-10-CM | POA: Diagnosis not present

## 2022-04-12 DIAGNOSIS — Z79899 Other long term (current) drug therapy: Secondary | ICD-10-CM | POA: Diagnosis not present

## 2022-04-12 DIAGNOSIS — N3091 Cystitis, unspecified with hematuria: Secondary | ICD-10-CM | POA: Diagnosis not present

## 2022-04-12 DIAGNOSIS — M328 Other forms of systemic lupus erythematosus: Secondary | ICD-10-CM | POA: Diagnosis not present

## 2022-04-12 DIAGNOSIS — G894 Chronic pain syndrome: Secondary | ICD-10-CM | POA: Diagnosis not present

## 2022-04-12 DIAGNOSIS — Z79891 Long term (current) use of opiate analgesic: Secondary | ICD-10-CM | POA: Diagnosis not present

## 2022-04-21 DIAGNOSIS — E7800 Pure hypercholesterolemia, unspecified: Secondary | ICD-10-CM | POA: Diagnosis not present

## 2022-04-21 DIAGNOSIS — Z0000 Encounter for general adult medical examination without abnormal findings: Secondary | ICD-10-CM | POA: Diagnosis not present

## 2022-04-21 DIAGNOSIS — Z1339 Encounter for screening examination for other mental health and behavioral disorders: Secondary | ICD-10-CM | POA: Diagnosis not present

## 2022-04-21 DIAGNOSIS — Z299 Encounter for prophylactic measures, unspecified: Secondary | ICD-10-CM | POA: Diagnosis not present

## 2022-04-21 DIAGNOSIS — Z1331 Encounter for screening for depression: Secondary | ICD-10-CM | POA: Diagnosis not present

## 2022-04-21 DIAGNOSIS — Z6829 Body mass index (BMI) 29.0-29.9, adult: Secondary | ICD-10-CM | POA: Diagnosis not present

## 2022-04-21 DIAGNOSIS — Z7189 Other specified counseling: Secondary | ICD-10-CM | POA: Diagnosis not present

## 2022-04-21 DIAGNOSIS — Z87891 Personal history of nicotine dependence: Secondary | ICD-10-CM | POA: Diagnosis not present

## 2022-04-21 DIAGNOSIS — Z79899 Other long term (current) drug therapy: Secondary | ICD-10-CM | POA: Diagnosis not present

## 2022-04-21 DIAGNOSIS — I10 Essential (primary) hypertension: Secondary | ICD-10-CM | POA: Diagnosis not present

## 2022-04-21 DIAGNOSIS — R5383 Other fatigue: Secondary | ICD-10-CM | POA: Diagnosis not present

## 2022-04-21 DIAGNOSIS — E039 Hypothyroidism, unspecified: Secondary | ICD-10-CM | POA: Diagnosis not present

## 2022-05-10 DIAGNOSIS — N3091 Cystitis, unspecified with hematuria: Secondary | ICD-10-CM | POA: Diagnosis not present

## 2022-05-10 DIAGNOSIS — M328 Other forms of systemic lupus erythematosus: Secondary | ICD-10-CM | POA: Diagnosis not present

## 2022-05-10 DIAGNOSIS — G894 Chronic pain syndrome: Secondary | ICD-10-CM | POA: Diagnosis not present

## 2022-06-02 DIAGNOSIS — Z79899 Other long term (current) drug therapy: Secondary | ICD-10-CM | POA: Diagnosis not present

## 2022-06-21 DIAGNOSIS — N3091 Cystitis, unspecified with hematuria: Secondary | ICD-10-CM | POA: Diagnosis not present

## 2022-06-21 DIAGNOSIS — M328 Other forms of systemic lupus erythematosus: Secondary | ICD-10-CM | POA: Diagnosis not present

## 2022-06-21 DIAGNOSIS — G894 Chronic pain syndrome: Secondary | ICD-10-CM | POA: Diagnosis not present

## 2022-06-21 DIAGNOSIS — Z79891 Long term (current) use of opiate analgesic: Secondary | ICD-10-CM | POA: Diagnosis not present

## 2022-06-21 DIAGNOSIS — Z79899 Other long term (current) drug therapy: Secondary | ICD-10-CM | POA: Diagnosis not present

## 2022-07-04 DIAGNOSIS — R700 Elevated erythrocyte sedimentation rate: Secondary | ICD-10-CM | POA: Diagnosis not present

## 2022-07-04 DIAGNOSIS — M329 Systemic lupus erythematosus, unspecified: Secondary | ICD-10-CM | POA: Diagnosis not present

## 2022-07-04 DIAGNOSIS — M79641 Pain in right hand: Secondary | ICD-10-CM | POA: Diagnosis not present

## 2022-07-04 DIAGNOSIS — Z6827 Body mass index (BMI) 27.0-27.9, adult: Secondary | ICD-10-CM | POA: Diagnosis not present

## 2022-07-04 DIAGNOSIS — M2560 Stiffness of unspecified joint, not elsewhere classified: Secondary | ICD-10-CM | POA: Diagnosis not present

## 2022-07-04 DIAGNOSIS — M059 Rheumatoid arthritis with rheumatoid factor, unspecified: Secondary | ICD-10-CM | POA: Diagnosis not present

## 2022-07-04 DIAGNOSIS — M79642 Pain in left hand: Secondary | ICD-10-CM | POA: Diagnosis not present

## 2022-07-04 DIAGNOSIS — M2540 Effusion, unspecified joint: Secondary | ICD-10-CM | POA: Diagnosis not present

## 2022-07-04 DIAGNOSIS — E663 Overweight: Secondary | ICD-10-CM | POA: Diagnosis not present

## 2022-07-19 DIAGNOSIS — Z79891 Long term (current) use of opiate analgesic: Secondary | ICD-10-CM | POA: Diagnosis not present

## 2022-07-19 DIAGNOSIS — M328 Other forms of systemic lupus erythematosus: Secondary | ICD-10-CM | POA: Diagnosis not present

## 2022-07-19 DIAGNOSIS — G894 Chronic pain syndrome: Secondary | ICD-10-CM | POA: Diagnosis not present

## 2022-07-19 DIAGNOSIS — N3091 Cystitis, unspecified with hematuria: Secondary | ICD-10-CM | POA: Diagnosis not present

## 2022-07-26 DIAGNOSIS — N1831 Chronic kidney disease, stage 3a: Secondary | ICD-10-CM | POA: Diagnosis not present

## 2022-07-26 DIAGNOSIS — J449 Chronic obstructive pulmonary disease, unspecified: Secondary | ICD-10-CM | POA: Diagnosis not present

## 2022-07-26 DIAGNOSIS — I5020 Unspecified systolic (congestive) heart failure: Secondary | ICD-10-CM | POA: Diagnosis not present

## 2022-07-26 DIAGNOSIS — I10 Essential (primary) hypertension: Secondary | ICD-10-CM | POA: Diagnosis not present

## 2022-07-26 DIAGNOSIS — Z299 Encounter for prophylactic measures, unspecified: Secondary | ICD-10-CM | POA: Diagnosis not present

## 2022-08-16 DIAGNOSIS — I4821 Permanent atrial fibrillation: Secondary | ICD-10-CM | POA: Diagnosis not present

## 2022-08-16 DIAGNOSIS — I10 Essential (primary) hypertension: Secondary | ICD-10-CM | POA: Diagnosis not present

## 2022-08-16 DIAGNOSIS — E785 Hyperlipidemia, unspecified: Secondary | ICD-10-CM | POA: Diagnosis not present

## 2022-08-16 DIAGNOSIS — I5020 Unspecified systolic (congestive) heart failure: Secondary | ICD-10-CM | POA: Diagnosis not present

## 2022-08-22 DIAGNOSIS — J449 Chronic obstructive pulmonary disease, unspecified: Secondary | ICD-10-CM | POA: Diagnosis not present

## 2022-08-22 DIAGNOSIS — D692 Other nonthrombocytopenic purpura: Secondary | ICD-10-CM | POA: Diagnosis not present

## 2022-08-22 DIAGNOSIS — J441 Chronic obstructive pulmonary disease with (acute) exacerbation: Secondary | ICD-10-CM | POA: Diagnosis not present

## 2022-08-23 DIAGNOSIS — M328 Other forms of systemic lupus erythematosus: Secondary | ICD-10-CM | POA: Diagnosis not present

## 2022-08-23 DIAGNOSIS — Z79891 Long term (current) use of opiate analgesic: Secondary | ICD-10-CM | POA: Diagnosis not present

## 2022-08-23 DIAGNOSIS — Z79899 Other long term (current) drug therapy: Secondary | ICD-10-CM | POA: Diagnosis not present

## 2022-08-23 DIAGNOSIS — G894 Chronic pain syndrome: Secondary | ICD-10-CM | POA: Diagnosis not present

## 2022-08-23 DIAGNOSIS — N3091 Cystitis, unspecified with hematuria: Secondary | ICD-10-CM | POA: Diagnosis not present

## 2022-09-20 DIAGNOSIS — N3091 Cystitis, unspecified with hematuria: Secondary | ICD-10-CM | POA: Diagnosis not present

## 2022-09-20 DIAGNOSIS — M328 Other forms of systemic lupus erythematosus: Secondary | ICD-10-CM | POA: Diagnosis not present

## 2022-09-20 DIAGNOSIS — Z79891 Long term (current) use of opiate analgesic: Secondary | ICD-10-CM | POA: Diagnosis not present

## 2022-09-20 DIAGNOSIS — G894 Chronic pain syndrome: Secondary | ICD-10-CM | POA: Diagnosis not present

## 2022-10-10 DIAGNOSIS — J069 Acute upper respiratory infection, unspecified: Secondary | ICD-10-CM | POA: Diagnosis not present

## 2022-10-10 DIAGNOSIS — I10 Essential (primary) hypertension: Secondary | ICD-10-CM | POA: Diagnosis not present

## 2022-10-10 DIAGNOSIS — R350 Frequency of micturition: Secondary | ICD-10-CM | POA: Diagnosis not present

## 2022-10-10 DIAGNOSIS — N390 Urinary tract infection, site not specified: Secondary | ICD-10-CM | POA: Diagnosis not present

## 2022-10-10 DIAGNOSIS — Z299 Encounter for prophylactic measures, unspecified: Secondary | ICD-10-CM | POA: Diagnosis not present

## 2022-10-19 DIAGNOSIS — M2560 Stiffness of unspecified joint, not elsewhere classified: Secondary | ICD-10-CM | POA: Diagnosis not present

## 2022-10-19 DIAGNOSIS — E663 Overweight: Secondary | ICD-10-CM | POA: Diagnosis not present

## 2022-10-19 DIAGNOSIS — M79641 Pain in right hand: Secondary | ICD-10-CM | POA: Diagnosis not present

## 2022-10-19 DIAGNOSIS — R700 Elevated erythrocyte sedimentation rate: Secondary | ICD-10-CM | POA: Diagnosis not present

## 2022-10-19 DIAGNOSIS — Z6827 Body mass index (BMI) 27.0-27.9, adult: Secondary | ICD-10-CM | POA: Diagnosis not present

## 2022-10-19 DIAGNOSIS — M059 Rheumatoid arthritis with rheumatoid factor, unspecified: Secondary | ICD-10-CM | POA: Diagnosis not present

## 2022-10-19 DIAGNOSIS — M79642 Pain in left hand: Secondary | ICD-10-CM | POA: Diagnosis not present

## 2022-10-19 DIAGNOSIS — M329 Systemic lupus erythematosus, unspecified: Secondary | ICD-10-CM | POA: Diagnosis not present

## 2022-10-19 DIAGNOSIS — M2540 Effusion, unspecified joint: Secondary | ICD-10-CM | POA: Diagnosis not present

## 2022-10-27 DIAGNOSIS — J069 Acute upper respiratory infection, unspecified: Secondary | ICD-10-CM | POA: Diagnosis not present

## 2022-10-27 DIAGNOSIS — Z299 Encounter for prophylactic measures, unspecified: Secondary | ICD-10-CM | POA: Diagnosis not present

## 2022-10-27 DIAGNOSIS — I10 Essential (primary) hypertension: Secondary | ICD-10-CM | POA: Diagnosis not present

## 2022-10-27 DIAGNOSIS — J209 Acute bronchitis, unspecified: Secondary | ICD-10-CM | POA: Diagnosis not present

## 2022-10-27 DIAGNOSIS — I5020 Unspecified systolic (congestive) heart failure: Secondary | ICD-10-CM | POA: Diagnosis not present

## 2022-10-27 DIAGNOSIS — J440 Chronic obstructive pulmonary disease with acute lower respiratory infection: Secondary | ICD-10-CM | POA: Diagnosis not present

## 2022-11-04 DIAGNOSIS — M329 Systemic lupus erythematosus, unspecified: Secondary | ICD-10-CM | POA: Diagnosis not present

## 2022-12-20 DIAGNOSIS — Z79891 Long term (current) use of opiate analgesic: Secondary | ICD-10-CM | POA: Diagnosis not present

## 2022-12-20 DIAGNOSIS — M328 Other forms of systemic lupus erythematosus: Secondary | ICD-10-CM | POA: Diagnosis not present

## 2022-12-20 DIAGNOSIS — G894 Chronic pain syndrome: Secondary | ICD-10-CM | POA: Diagnosis not present

## 2022-12-20 DIAGNOSIS — N3091 Cystitis, unspecified with hematuria: Secondary | ICD-10-CM | POA: Diagnosis not present

## 2023-01-19 DIAGNOSIS — M79642 Pain in left hand: Secondary | ICD-10-CM | POA: Diagnosis not present

## 2023-01-19 DIAGNOSIS — M059 Rheumatoid arthritis with rheumatoid factor, unspecified: Secondary | ICD-10-CM | POA: Diagnosis not present

## 2023-01-19 DIAGNOSIS — R8290 Unspecified abnormal findings in urine: Secondary | ICD-10-CM | POA: Diagnosis not present

## 2023-01-19 DIAGNOSIS — M2560 Stiffness of unspecified joint, not elsewhere classified: Secondary | ICD-10-CM | POA: Diagnosis not present

## 2023-01-19 DIAGNOSIS — M2540 Effusion, unspecified joint: Secondary | ICD-10-CM | POA: Diagnosis not present

## 2023-01-19 DIAGNOSIS — R700 Elevated erythrocyte sedimentation rate: Secondary | ICD-10-CM | POA: Diagnosis not present

## 2023-01-19 DIAGNOSIS — M79641 Pain in right hand: Secondary | ICD-10-CM | POA: Diagnosis not present

## 2023-01-19 DIAGNOSIS — M329 Systemic lupus erythematosus, unspecified: Secondary | ICD-10-CM | POA: Diagnosis not present

## 2023-01-19 DIAGNOSIS — Z111 Encounter for screening for respiratory tuberculosis: Secondary | ICD-10-CM | POA: Diagnosis not present

## 2023-03-14 DIAGNOSIS — G894 Chronic pain syndrome: Secondary | ICD-10-CM | POA: Diagnosis not present

## 2023-03-14 DIAGNOSIS — M328 Other forms of systemic lupus erythematosus: Secondary | ICD-10-CM | POA: Diagnosis not present

## 2023-03-14 DIAGNOSIS — N3091 Cystitis, unspecified with hematuria: Secondary | ICD-10-CM | POA: Diagnosis not present

## 2023-03-14 DIAGNOSIS — Z79891 Long term (current) use of opiate analgesic: Secondary | ICD-10-CM | POA: Diagnosis not present

## 2023-04-04 IMAGING — DX DG ANKLE COMPLETE 3+V*R*
3 series · 3 of 3 positions shown · non-contrast
Comparison: None.

CLINICAL DATA: Fall, right ankle deformity

EXAM:
RIGHT ANKLE - COMPLETE 3+ VIEW

[ankle ap]
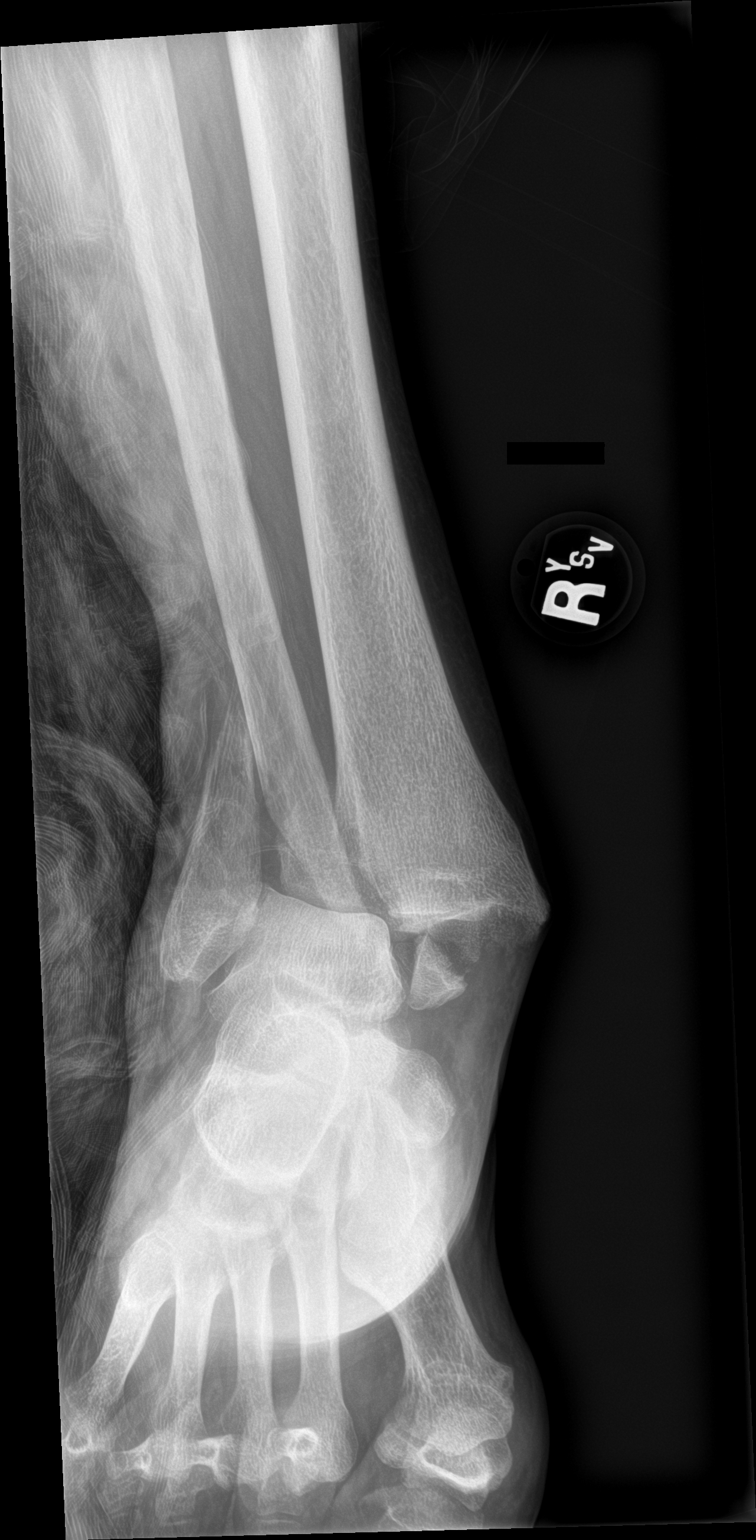

[ankle obl]
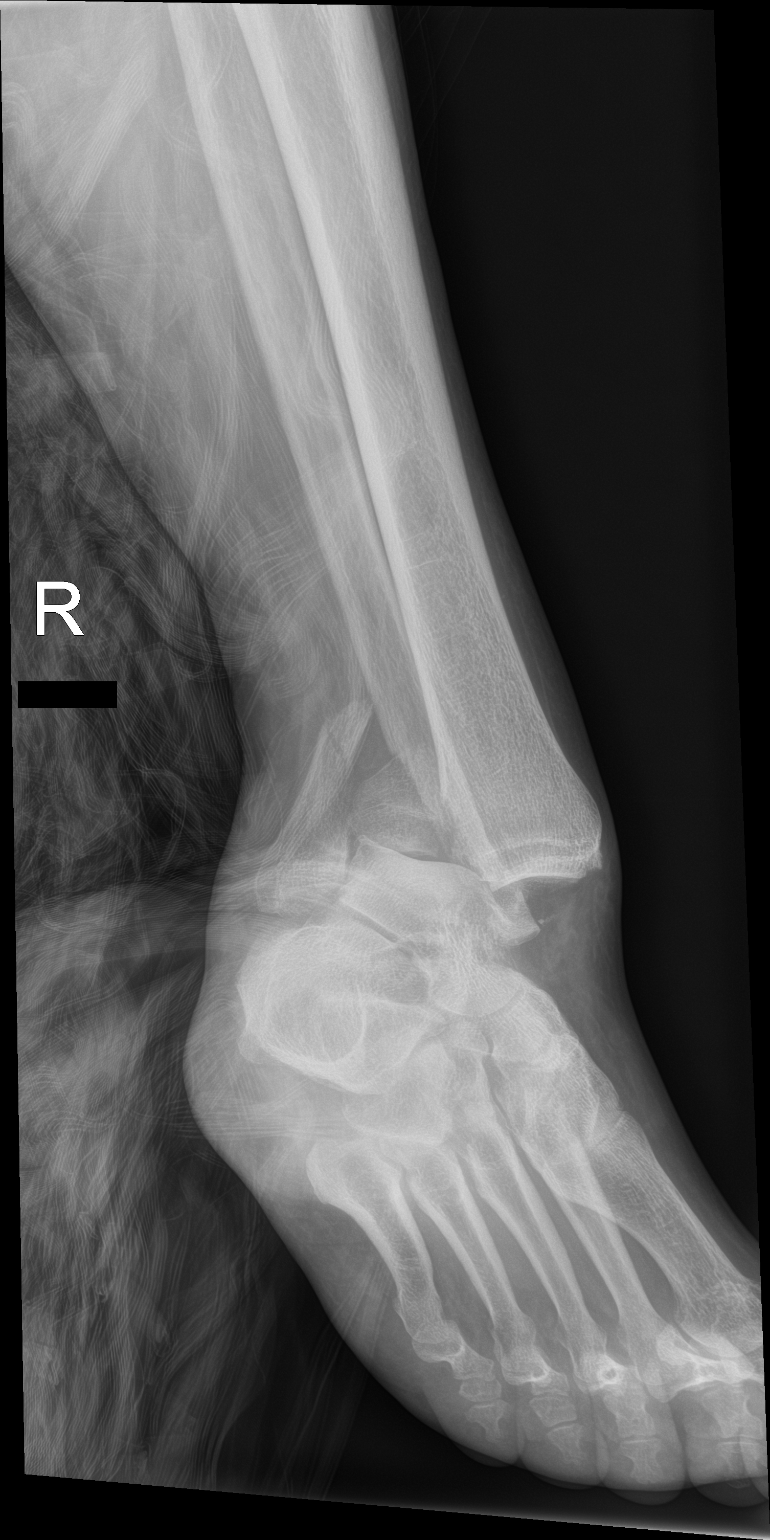

[ankle lat]
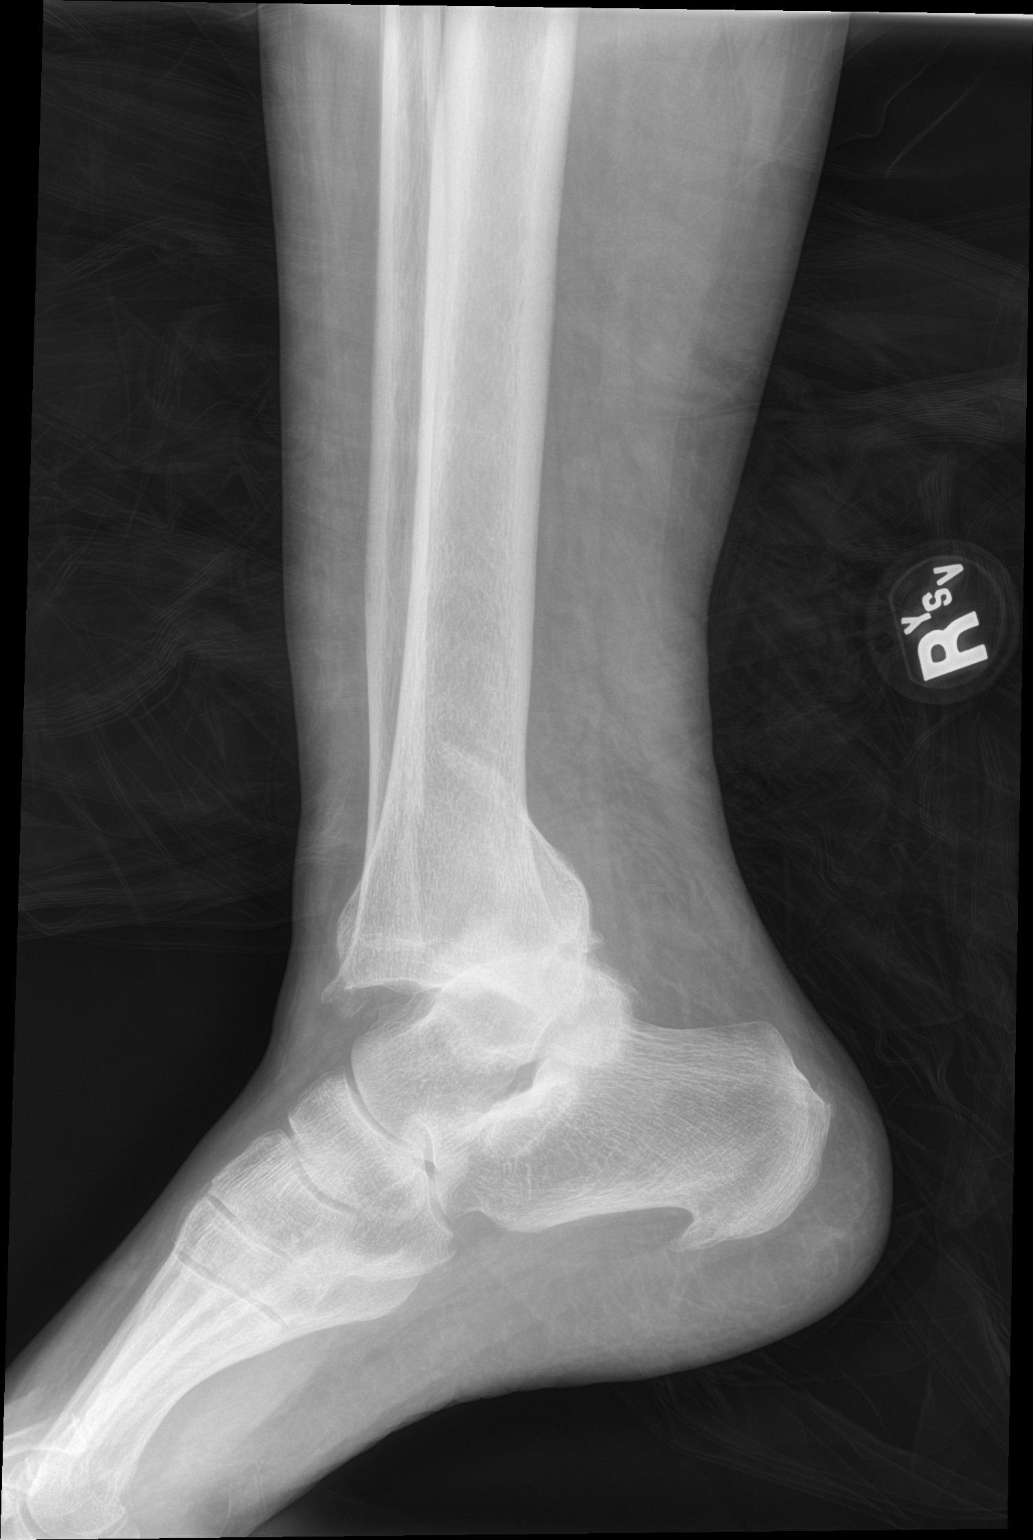

[3 of 3 positions shown; findings below may reference images not displayed]

FINDINGS: Three view radiograph right ankle demonstrates a trimalleolar
fracture dislocation.

Oblique fracture of the distal right fibular metaphysis is noted at
the level to the tibial plafond and with roughly 2.7 cm override, 1
shaft with posterolateral displacement, and moderate posterolateral
angulation of the distal fracture fragment.

There is a intra-articular fracture of the posteromedial distal
tibia with roughly 1.6 cm override, 2-3 cortical widths lateral
displacement and moderate posterior angulation of the fracture
fragment.

A avulsion type fracture of the medial malleolus is present with
approximately 2.8 cm posterolateral displacement and moderate
lateral angulation of the distal fracture fragment.

There is posterolateral dislocation of the talar dome in relation to
the distal tibia.

Moderate plantar calcaneal spur.
IMPRESSION: Trimalleolar fracture dislocation of the right ankle as described
above.

## 2023-04-11 DIAGNOSIS — I10 Essential (primary) hypertension: Secondary | ICD-10-CM | POA: Diagnosis not present

## 2023-04-11 DIAGNOSIS — T783XXA Angioneurotic edema, initial encounter: Secondary | ICD-10-CM | POA: Diagnosis not present

## 2023-04-11 DIAGNOSIS — J449 Chronic obstructive pulmonary disease, unspecified: Secondary | ICD-10-CM | POA: Diagnosis not present

## 2023-04-11 DIAGNOSIS — I38 Endocarditis, valve unspecified: Secondary | ICD-10-CM | POA: Diagnosis not present

## 2023-04-11 DIAGNOSIS — I5020 Unspecified systolic (congestive) heart failure: Secondary | ICD-10-CM | POA: Diagnosis not present

## 2023-04-11 DIAGNOSIS — M329 Systemic lupus erythematosus, unspecified: Secondary | ICD-10-CM | POA: Diagnosis not present

## 2023-04-11 DIAGNOSIS — I2510 Atherosclerotic heart disease of native coronary artery without angina pectoris: Secondary | ICD-10-CM | POA: Diagnosis not present

## 2023-04-11 DIAGNOSIS — I272 Pulmonary hypertension, unspecified: Secondary | ICD-10-CM | POA: Diagnosis not present

## 2023-04-11 DIAGNOSIS — E785 Hyperlipidemia, unspecified: Secondary | ICD-10-CM | POA: Diagnosis not present

## 2023-04-17 DIAGNOSIS — I10 Essential (primary) hypertension: Secondary | ICD-10-CM | POA: Diagnosis not present

## 2023-04-17 DIAGNOSIS — R350 Frequency of micturition: Secondary | ICD-10-CM | POA: Diagnosis not present

## 2023-04-17 DIAGNOSIS — J029 Acute pharyngitis, unspecified: Secondary | ICD-10-CM | POA: Diagnosis not present

## 2023-04-17 DIAGNOSIS — I700 Atherosclerosis of aorta: Secondary | ICD-10-CM | POA: Diagnosis not present

## 2023-04-17 DIAGNOSIS — M329 Systemic lupus erythematosus, unspecified: Secondary | ICD-10-CM | POA: Diagnosis not present

## 2023-04-17 DIAGNOSIS — I5020 Unspecified systolic (congestive) heart failure: Secondary | ICD-10-CM | POA: Diagnosis not present

## 2023-04-17 DIAGNOSIS — Z299 Encounter for prophylactic measures, unspecified: Secondary | ICD-10-CM | POA: Diagnosis not present

## 2023-04-17 DIAGNOSIS — J449 Chronic obstructive pulmonary disease, unspecified: Secondary | ICD-10-CM | POA: Diagnosis not present

## 2023-05-10 DIAGNOSIS — M79642 Pain in left hand: Secondary | ICD-10-CM | POA: Diagnosis not present

## 2023-05-10 DIAGNOSIS — M2540 Effusion, unspecified joint: Secondary | ICD-10-CM | POA: Diagnosis not present

## 2023-05-10 DIAGNOSIS — M79641 Pain in right hand: Secondary | ICD-10-CM | POA: Diagnosis not present

## 2023-05-10 DIAGNOSIS — M2560 Stiffness of unspecified joint, not elsewhere classified: Secondary | ICD-10-CM | POA: Diagnosis not present

## 2023-05-10 DIAGNOSIS — M329 Systemic lupus erythematosus, unspecified: Secondary | ICD-10-CM | POA: Diagnosis not present

## 2023-05-10 DIAGNOSIS — Z6827 Body mass index (BMI) 27.0-27.9, adult: Secondary | ICD-10-CM | POA: Diagnosis not present

## 2023-05-10 DIAGNOSIS — M059 Rheumatoid arthritis with rheumatoid factor, unspecified: Secondary | ICD-10-CM | POA: Diagnosis not present

## 2023-05-10 DIAGNOSIS — E663 Overweight: Secondary | ICD-10-CM | POA: Diagnosis not present

## 2023-06-06 DIAGNOSIS — G894 Chronic pain syndrome: Secondary | ICD-10-CM | POA: Diagnosis not present

## 2023-06-06 DIAGNOSIS — Z79891 Long term (current) use of opiate analgesic: Secondary | ICD-10-CM | POA: Diagnosis not present

## 2023-06-06 DIAGNOSIS — N3091 Cystitis, unspecified with hematuria: Secondary | ICD-10-CM | POA: Diagnosis not present

## 2023-06-06 DIAGNOSIS — M328 Other forms of systemic lupus erythematosus: Secondary | ICD-10-CM | POA: Diagnosis not present

## 2023-07-03 DIAGNOSIS — Z0000 Encounter for general adult medical examination without abnormal findings: Secondary | ICD-10-CM | POA: Diagnosis not present

## 2023-07-03 DIAGNOSIS — R5383 Other fatigue: Secondary | ICD-10-CM | POA: Diagnosis not present

## 2023-07-03 DIAGNOSIS — I10 Essential (primary) hypertension: Secondary | ICD-10-CM | POA: Diagnosis not present

## 2023-07-03 DIAGNOSIS — Z87891 Personal history of nicotine dependence: Secondary | ICD-10-CM | POA: Diagnosis not present

## 2023-07-03 DIAGNOSIS — Z299 Encounter for prophylactic measures, unspecified: Secondary | ICD-10-CM | POA: Diagnosis not present

## 2023-07-03 DIAGNOSIS — E039 Hypothyroidism, unspecified: Secondary | ICD-10-CM | POA: Diagnosis not present

## 2023-07-03 DIAGNOSIS — Z1331 Encounter for screening for depression: Secondary | ICD-10-CM | POA: Diagnosis not present

## 2023-07-03 DIAGNOSIS — Z7189 Other specified counseling: Secondary | ICD-10-CM | POA: Diagnosis not present

## 2023-07-03 DIAGNOSIS — Z1339 Encounter for screening examination for other mental health and behavioral disorders: Secondary | ICD-10-CM | POA: Diagnosis not present

## 2023-07-03 DIAGNOSIS — E7800 Pure hypercholesterolemia, unspecified: Secondary | ICD-10-CM | POA: Diagnosis not present

## 2023-07-03 DIAGNOSIS — Z79899 Other long term (current) drug therapy: Secondary | ICD-10-CM | POA: Diagnosis not present

## 2023-07-31 DIAGNOSIS — J449 Chronic obstructive pulmonary disease, unspecified: Secondary | ICD-10-CM | POA: Diagnosis not present

## 2023-07-31 DIAGNOSIS — Z0000 Encounter for general adult medical examination without abnormal findings: Secondary | ICD-10-CM | POA: Diagnosis not present

## 2023-07-31 DIAGNOSIS — Z6828 Body mass index (BMI) 28.0-28.9, adult: Secondary | ICD-10-CM | POA: Diagnosis not present

## 2023-07-31 DIAGNOSIS — E6609 Other obesity due to excess calories: Secondary | ICD-10-CM | POA: Diagnosis not present

## 2023-07-31 DIAGNOSIS — N390 Urinary tract infection, site not specified: Secondary | ICD-10-CM | POA: Diagnosis not present

## 2023-07-31 DIAGNOSIS — I10 Essential (primary) hypertension: Secondary | ICD-10-CM | POA: Diagnosis not present

## 2023-07-31 DIAGNOSIS — R52 Pain, unspecified: Secondary | ICD-10-CM | POA: Diagnosis not present

## 2023-07-31 DIAGNOSIS — Z299 Encounter for prophylactic measures, unspecified: Secondary | ICD-10-CM | POA: Diagnosis not present

## 2023-07-31 DIAGNOSIS — I5020 Unspecified systolic (congestive) heart failure: Secondary | ICD-10-CM | POA: Diagnosis not present

## 2023-08-01 DIAGNOSIS — E2839 Other primary ovarian failure: Secondary | ICD-10-CM | POA: Diagnosis not present

## 2023-08-17 DIAGNOSIS — Z299 Encounter for prophylactic measures, unspecified: Secondary | ICD-10-CM | POA: Diagnosis not present

## 2023-08-17 DIAGNOSIS — I10 Essential (primary) hypertension: Secondary | ICD-10-CM | POA: Diagnosis not present

## 2023-08-17 DIAGNOSIS — N399 Disorder of urinary system, unspecified: Secondary | ICD-10-CM | POA: Diagnosis not present

## 2023-08-17 DIAGNOSIS — N390 Urinary tract infection, site not specified: Secondary | ICD-10-CM | POA: Diagnosis not present

## 2023-08-29 DIAGNOSIS — M328 Other forms of systemic lupus erythematosus: Secondary | ICD-10-CM | POA: Diagnosis not present

## 2023-08-29 DIAGNOSIS — N3091 Cystitis, unspecified with hematuria: Secondary | ICD-10-CM | POA: Diagnosis not present

## 2023-08-29 DIAGNOSIS — G894 Chronic pain syndrome: Secondary | ICD-10-CM | POA: Diagnosis not present

## 2023-08-29 DIAGNOSIS — Z79891 Long term (current) use of opiate analgesic: Secondary | ICD-10-CM | POA: Diagnosis not present

## 2023-09-27 DIAGNOSIS — M329 Systemic lupus erythematosus, unspecified: Secondary | ICD-10-CM | POA: Diagnosis not present

## 2023-09-27 DIAGNOSIS — M2560 Stiffness of unspecified joint, not elsewhere classified: Secondary | ICD-10-CM | POA: Diagnosis not present

## 2023-09-27 DIAGNOSIS — M2540 Effusion, unspecified joint: Secondary | ICD-10-CM | POA: Diagnosis not present

## 2023-09-27 DIAGNOSIS — M79641 Pain in right hand: Secondary | ICD-10-CM | POA: Diagnosis not present

## 2023-09-27 DIAGNOSIS — M79642 Pain in left hand: Secondary | ICD-10-CM | POA: Diagnosis not present

## 2023-09-27 DIAGNOSIS — Z6826 Body mass index (BMI) 26.0-26.9, adult: Secondary | ICD-10-CM | POA: Diagnosis not present

## 2023-09-27 DIAGNOSIS — E663 Overweight: Secondary | ICD-10-CM | POA: Diagnosis not present

## 2023-09-27 DIAGNOSIS — M059 Rheumatoid arthritis with rheumatoid factor, unspecified: Secondary | ICD-10-CM | POA: Diagnosis not present

## 2023-10-05 DIAGNOSIS — J449 Chronic obstructive pulmonary disease, unspecified: Secondary | ICD-10-CM | POA: Diagnosis not present

## 2023-10-05 DIAGNOSIS — I10 Essential (primary) hypertension: Secondary | ICD-10-CM | POA: Diagnosis not present

## 2023-10-05 DIAGNOSIS — I38 Endocarditis, valve unspecified: Secondary | ICD-10-CM | POA: Diagnosis not present

## 2023-10-05 DIAGNOSIS — I272 Pulmonary hypertension, unspecified: Secondary | ICD-10-CM | POA: Diagnosis not present

## 2023-10-05 DIAGNOSIS — E785 Hyperlipidemia, unspecified: Secondary | ICD-10-CM | POA: Diagnosis not present

## 2023-10-05 DIAGNOSIS — T464X5A Adverse effect of angiotensin-converting-enzyme inhibitors, initial encounter: Secondary | ICD-10-CM | POA: Diagnosis not present

## 2023-10-05 DIAGNOSIS — I2510 Atherosclerotic heart disease of native coronary artery without angina pectoris: Secondary | ICD-10-CM | POA: Diagnosis not present

## 2023-10-05 DIAGNOSIS — T783XXA Angioneurotic edema, initial encounter: Secondary | ICD-10-CM | POA: Diagnosis not present

## 2023-10-05 DIAGNOSIS — I5020 Unspecified systolic (congestive) heart failure: Secondary | ICD-10-CM | POA: Diagnosis not present

## 2023-10-19 DIAGNOSIS — I080 Rheumatic disorders of both mitral and aortic valves: Secondary | ICD-10-CM | POA: Diagnosis not present

## 2023-10-19 DIAGNOSIS — I38 Endocarditis, valve unspecified: Secondary | ICD-10-CM | POA: Diagnosis not present

## 2023-11-28 DIAGNOSIS — Z79891 Long term (current) use of opiate analgesic: Secondary | ICD-10-CM | POA: Diagnosis not present

## 2023-11-28 DIAGNOSIS — G894 Chronic pain syndrome: Secondary | ICD-10-CM | POA: Diagnosis not present

## 2023-11-28 DIAGNOSIS — M328 Other forms of systemic lupus erythematosus: Secondary | ICD-10-CM | POA: Diagnosis not present
